# Patient Record
Sex: Male | Born: 1970 | Race: White | Hispanic: No | Marital: Married | Smoking: Former smoker
Health system: Southern US, Community
[De-identification: ages and names within clinical notes are randomized; demographics above are authoritative.]

## PROBLEM LIST (undated history)

## (undated) DIAGNOSIS — K8689 Other specified diseases of pancreas: Secondary | ICD-10-CM

## (undated) DIAGNOSIS — J690 Pneumonitis due to inhalation of food and vomit: Secondary | ICD-10-CM

## (undated) DIAGNOSIS — C241 Malignant neoplasm of ampulla of Vater: Secondary | ICD-10-CM

## (undated) DIAGNOSIS — K219 Gastro-esophageal reflux disease without esophagitis: Secondary | ICD-10-CM

## (undated) DIAGNOSIS — F419 Anxiety disorder, unspecified: Secondary | ICD-10-CM

## (undated) DIAGNOSIS — G8929 Other chronic pain: Secondary | ICD-10-CM

## (undated) DIAGNOSIS — Z87442 Personal history of urinary calculi: Secondary | ICD-10-CM

---

## 2007-01-13 ENCOUNTER — Emergency Department (HOSPITAL_COMMUNITY): Admission: EM | Admit: 2007-01-13 | Discharge: 2007-01-13 | Payer: Self-pay | Admitting: *Deleted

## 2012-07-22 ENCOUNTER — Other Ambulatory Visit (HOSPITAL_COMMUNITY): Payer: Self-pay | Admitting: Family Medicine

## 2012-07-22 ENCOUNTER — Ambulatory Visit (HOSPITAL_COMMUNITY)
Admission: RE | Admit: 2012-07-22 | Discharge: 2012-07-22 | Disposition: A | Discharge status: Home / Self Care | Payer: PRIVATE HEALTH INSURANCE | Source: Ambulatory Visit | Attending: Family Medicine | Admitting: Family Medicine

## 2012-07-22 DIAGNOSIS — M79671 Pain in right foot: Secondary | ICD-10-CM

## 2012-07-22 DIAGNOSIS — M25579 Pain in unspecified ankle and joints of unspecified foot: Secondary | ICD-10-CM | POA: Insufficient documentation

## 2012-07-22 DIAGNOSIS — S8990XA Unspecified injury of unspecified lower leg, initial encounter: Secondary | ICD-10-CM | POA: Insufficient documentation

## 2012-07-22 DIAGNOSIS — S99919A Unspecified injury of unspecified ankle, initial encounter: Secondary | ICD-10-CM | POA: Insufficient documentation

## 2012-07-22 DIAGNOSIS — S99929A Unspecified injury of unspecified foot, initial encounter: Secondary | ICD-10-CM | POA: Insufficient documentation

## 2012-07-22 DIAGNOSIS — M25571 Pain in right ankle and joints of right foot: Secondary | ICD-10-CM

## 2012-07-22 DIAGNOSIS — X500XXA Overexertion from strenuous movement or load, initial encounter: Secondary | ICD-10-CM | POA: Insufficient documentation

## 2016-03-21 ENCOUNTER — Encounter: Payer: Self-pay | Admitting: Gastroenterology

## 2016-03-21 ENCOUNTER — Ambulatory Visit (INDEPENDENT_AMBULATORY_CARE_PROVIDER_SITE_OTHER): Payer: PRIVATE HEALTH INSURANCE | Admitting: Gastroenterology

## 2016-03-21 VITALS — BP 124/67 | HR 89 | Temp 98.0°F | Ht 71.0 in | Wt 263.2 lb

## 2016-03-21 DIAGNOSIS — R7989 Other specified abnormal findings of blood chemistry: Secondary | ICD-10-CM

## 2016-03-21 DIAGNOSIS — R945 Abnormal results of liver function studies: Secondary | ICD-10-CM

## 2016-03-21 DIAGNOSIS — D649 Anemia, unspecified: Secondary | ICD-10-CM | POA: Diagnosis not present

## 2016-03-21 DIAGNOSIS — K921 Melena: Secondary | ICD-10-CM | POA: Diagnosis not present

## 2016-03-21 NOTE — Assessment & Plan Note (Signed)
46 year old gentleman with recent bump in LFTs, jaundice, pruritus of unclear etiology. Viral markers negative. Abdominal ultrasound with fatty liver. CBD diameter normal. No gallstones or gallbladder disease noted. Interestingly, patient had been on a ketogenic diet, losing 40 pounds in 2 months, using Lipozene. According to PCP note he was also using MVI, testosterone booster the patient did not disclose this information to me. Denies alcohol abuse. Outside of pruritus, clinically he has been feeling okay. Interestingly patient has noted greatest benefit after taking prednisone for the itching last week.  Recommend checking for hemachromatosis, autoimmune hepatitis, PBC. Have not ruled out drug effect. Also appears to have new anemia, reported melena 3 weeks ago. Intermittent heartburn well-controlled on Zantac. Plan to update CBC. Further recommendations to follow.

## 2016-03-21 NOTE — Patient Instructions (Signed)
1. Do not take anything for weight loss or herbal medications until we determine source for recent liver issues.  2. I will review records from [facility] and let you know what additional labs are needed.

## 2016-03-21 NOTE — Progress Notes (Signed)
Primary Care Physician:  Curlene Labrum, MD  Primary Gastroenterologist:  Garfield Cornea, MD   Chief Complaint  Patient presents with  . Abnormal LFT'S    HPI:  Luis Frey is a 46 y.o. 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   Patient states he was told at the plasma center in [place] that his ALT was 3X normal and he needed to see his PCP. He had also noted 3 weeks of itching, dark colored urine and decided to go to ER. When he presented January 23 his total bilirubin was 4.9, alkaline phosphatase 347, AST 69.2, ALT 169, albumin 3.9. CBC unremarkable. The following day his total bilirubin was 4.6 with direct bilirubin 3.7, AST 65, ALT 154, alkaline phosphatase 290. January 25 total bilirubin 3.2, direct bilirubin 2.2, alkaline phosphatase 268, AST 84.7, ALT 180. Hepatitis A IgM negative, hepatitis B surface antigen negative, hepatitis B core IgM negative, hepatitis C antibody negative.   Labs dated 03/16/2016 by PCP with hemoglobin 10.9, hematocrit 33.9, MCV 88, platelets were 10,000, white blood cell count 7000, total bilirubin 1.9, alkaline phosphatase 279, AST 35, ALT 69, albumin 3.7. TSH 1.3. Amylase and lipase were normal.  Abdominal ultrasound 03/07/2016, gallbladder unremarkable. Fatty liver. Increased echotexture of the soft tissues adjacent to the left kidney, nonspecific. 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  Patient reports pain on a ketogenic diet for about 2 months. Extremely low-carb eating. Also taking over-the-counter Lipozene which she stopped when the itching started about 3 weeks ago. Also he is using multivitamins, testosterone booster he got on line. Denies any other herbal medications. Reports losing 40 pounds in 2 months. Reports black stools for 2 days, currently about 3 weeks ago. Denies bright red blood per rectum. No constipation or diarrhea. He does have  intermittent severe heartburn which responds to Zantac within 10 minutes of taking. He denies abdominal pain. Remote aspirin powder abuse 5 years ago. Takes Aleve or Tylenol on occasion but not very often. Never takes more than the bottle recommends. Drives a truck for a living. Does not like to take medications. No illegal drugs. Does consume moonshine about once every 3 months. The last time was Super Bowl Sunday but had been several months before that since he drank.  He was treated with prednisone pack for February 1 of the sixth. Started on hydroxyzine. Patient states this helped his itching.   Patient is very interested in resuming Lipozene.     Current Outpatient Prescriptions  Medication Sig Dispense Refill  . hydrOXYzine (ATARAX/VISTARIL) 25 MG tablet Take by mouth.     No current facility-administered medications for this visit.     Allergies as of 03/21/2016  . (No Known Allergies)    No past medical history on file. NONE  Past Surgical History:  Procedure Laterality Date  . none      Family History  Problem Relation Age of Onset  . Liver disease Neg Hx     Social History   Social History  . Marital status: Single    Spouse name: N/A  . Number of children: N/A  . Years of education: N/A   Occupational History  . Not on file.   Social History Main Topics  . Smoking status: Never Smoker  . Smokeless tobacco: Never Used  . Alcohol use Yes     Comment: socially , moonshine once every 3 months  . Drug use: No  .  Sexual activity: Not on file   Other Topics Concern  . Not on file   Social History Narrative  . No narrative on file      ROS:  General: Negative for anorexia, unintentional weight loss, fever, chills, fatigue, weakness. Eyes: Negative for vision changes.  ENT: Negative for hoarseness, difficulty swallowing , nasal congestion. CV: Negative for chest pain, angina, palpitations, dyspnea on exertion, peripheral edema.  Respiratory: Negative  for dyspnea at rest, dyspnea on exertion, cough, sputum, wheezing.  GI: See history of present illness. GU:  Negative for dysuria, hematuria, urinary incontinence, urinary frequency, nocturnal urination.  MS: Negative for joint pain, low back pain.  Derm: Negative for rash. + itching but improved Neuro: Negative for weakness, abnormal sensation, seizure, frequent headaches, memory loss, confusion.  Psych: Negative for anxiety, depression, suicidal ideation, hallucinations.  Endo: Negative for unusual weight change.  Heme: Negative for bruising or bleeding. Allergy: Negative for rash or hives.    Physical Examination:  BP 124/67   Pulse 89   Temp 98 F (36.7 C) (Oral)   Ht 5\' 11"  (1.803 m)   Wt 263 lb 3.2 oz (119.4 kg)   BMI 36.71 kg/m    General: Well-nourished, well-developed in no acute distress.  Head: Normocephalic, atraumatic.   Eyes: Conjunctiva pink, no icterus. Mouth: Oropharyngeal mucosa moist and pink , no lesions erythema or exudate. Neck: Supple without thyromegaly, masses, or lymphadenopathy.  Lungs: Clear to auscultation bilaterally.  Heart: Regular rate and rhythm, no murmurs rubs or gallops.  Abdomen: Bowel sounds are normal, nontender, nondistended, no hepatosplenomegaly or masses, no abdominal bruits or    hernia , no rebound or guarding.   Rectal: not performed Extremities: No lower extremity edema. No clubbing or deformities.  Neuro: Alert and oriented x 4 , grossly normal neurologically.  Skin: Warm and dry, no rash or jaundice.   Psych: Alert and cooperative, normal mood and affect.  Labs: See above  Imaging Studies: No results found.

## 2016-03-21 NOTE — Progress Notes (Signed)
These let patient know that I received his records and have reviewed them. He still needs further labs, orders placed. Please have them done within the next 1-2 days.

## 2016-03-22 NOTE — Progress Notes (Signed)
cc'ed to pcp °

## 2016-03-22 NOTE — Progress Notes (Signed)
Tried to call pt- NA-LMOM asking him to go to the lab for additional blood work.

## 2016-03-22 NOTE — Progress Notes (Signed)
Pt is aware. He requested that I fax the orders to [facility] so he can have it done there because he lives in [place] and he can go by there when he gets off work. I have faxed the orders to [facility]. Pt said he would have it done today.

## 2016-04-13 ENCOUNTER — Telehealth: Payer: Self-pay | Admitting: Gastroenterology

## 2016-04-13 NOTE — Telephone Encounter (Signed)
I have not seen labs yet on this patient. He said he was going to have done at [facility]. Can we check on this.

## 2016-04-13 NOTE — Telephone Encounter (Signed)
Only labs received from [facility] were old. I bet he hasn't had my labs done yet.   Can we ask patient to have his labs done?

## 2016-04-13 NOTE — Telephone Encounter (Signed)
Requested any recent labs from [facility] °

## 2016-04-17 NOTE — Telephone Encounter (Signed)
Tried to call pt- LM. Also mailed letter and lab orders to the pt.

## 2016-04-17 NOTE — Telephone Encounter (Signed)
noted 

## 2016-07-04 ENCOUNTER — Telehealth: Payer: Self-pay | Admitting: Internal Medicine

## 2016-07-04 ENCOUNTER — Other Ambulatory Visit: Payer: Self-pay | Admitting: Gastroenterology

## 2016-07-04 ENCOUNTER — Other Ambulatory Visit (HOSPITAL_COMMUNITY)
Admission: RE | Admit: 2016-07-04 | Discharge: 2016-07-04 | Disposition: A | Discharge status: Home / Self Care | Payer: PRIVATE HEALTH INSURANCE | Source: Ambulatory Visit | Attending: Gastroenterology | Admitting: Gastroenterology

## 2016-07-04 ENCOUNTER — Ambulatory Visit (HOSPITAL_COMMUNITY)
Admission: RE | Admit: 2016-07-04 | Discharge: 2016-07-04 | Disposition: A | Discharge status: Home / Self Care | Payer: PRIVATE HEALTH INSURANCE | Source: Ambulatory Visit | Attending: Gastroenterology | Admitting: Gastroenterology

## 2016-07-04 ENCOUNTER — Telehealth: Payer: Self-pay

## 2016-07-04 DIAGNOSIS — R1011 Right upper quadrant pain: Secondary | ICD-10-CM

## 2016-07-04 DIAGNOSIS — R7989 Other specified abnormal findings of blood chemistry: Secondary | ICD-10-CM

## 2016-07-04 DIAGNOSIS — R945 Abnormal results of liver function studies: Secondary | ICD-10-CM

## 2016-07-04 DIAGNOSIS — K838 Other specified diseases of biliary tract: Secondary | ICD-10-CM

## 2016-07-04 LAB — CBC WITH DIFFERENTIAL/PLATELET
BASOS PCT: 0 %
Basophils Absolute: 0 10*3/uL (ref 0.0–0.1)
EOS ABS: 0.2 10*3/uL (ref 0.0–0.7)
Eosinophils Relative: 3 %
HCT: 36 % — ABNORMAL LOW (ref 39.0–52.0)
HEMOGLOBIN: 11.8 g/dL — AB (ref 13.0–17.0)
Lymphocytes Relative: 47 %
Lymphs Abs: 3.4 10*3/uL (ref 0.7–4.0)
MCH: 27.4 pg (ref 26.0–34.0)
MCHC: 32.8 g/dL (ref 30.0–36.0)
MCV: 83.5 fL (ref 78.0–100.0)
MONOS PCT: 9 %
Monocytes Absolute: 0.6 10*3/uL (ref 0.1–1.0)
Neutro Abs: 2.9 10*3/uL (ref 1.7–7.7)
Neutrophils Relative %: 41 %
Platelets: 393 10*3/uL (ref 150–400)
RBC: 4.31 MIL/uL (ref 4.22–5.81)
RDW: 18.4 % — ABNORMAL HIGH (ref 11.5–15.5)
WBC: 7.1 10*3/uL (ref 4.0–10.5)

## 2016-07-04 LAB — HEPATIC FUNCTION PANEL
ALBUMIN: 3.1 g/dL — AB (ref 3.5–5.0)
ALK PHOS: 564 U/L — AB (ref 38–126)
ALT: 105 U/L — ABNORMAL HIGH (ref 17–63)
AST: 67 U/L — ABNORMAL HIGH (ref 15–41)
Bilirubin, Direct: 3 mg/dL — ABNORMAL HIGH (ref 0.1–0.5)
Indirect Bilirubin: 1.3 mg/dL — ABNORMAL HIGH (ref 0.3–0.9)
TOTAL PROTEIN: 6.9 g/dL (ref 6.5–8.1)
Total Bilirubin: 4.3 mg/dL — ABNORMAL HIGH (ref 0.3–1.2)

## 2016-07-04 LAB — LIPASE, BLOOD: LIPASE: 40 U/L (ref 11–51)

## 2016-07-04 MED ORDER — IOPAMIDOL (ISOVUE-300) INJECTION 61%
100.0000 mL | Freq: Once | INTRAVENOUS | Status: AC | PRN
  Administered 2016-07-04: 100 mL via INTRAVENOUS

## 2016-07-04 NOTE — Telephone Encounter (Signed)
Labs have been faxed to the hospital and he is aware to go on to the hospital

## 2016-07-04 NOTE — Telephone Encounter (Signed)
NO PA is needed per Dionne Bucy 07/04/16

## 2016-07-04 NOTE — Addendum Note (Signed)
Addended by: Mahala Menghini on: 07/04/2016 03:31 PM   Modules accepted: Orders

## 2016-07-04 NOTE — Telephone Encounter (Signed)
CTcalled about his report; markedly dilated bile duct with possible mass distal CBD/ampulla.  Needs ERCP w stent bx, etc.  I called all numbers in chart.  No luck getting him.  Will re-attempt first thing in am.

## 2016-07-04 NOTE — Telephone Encounter (Signed)
Discussed with RMR. We could not get patient's MRCP done at [facility] due to claustrophobia and tightness of machine. Per Ginger, multiple places have been called about OPEN MRI or MRI large bore and NO ONE can due before 2 weeks even if ordered STAT.   Plan for CT pancreatic protocol now.

## 2016-07-04 NOTE — Telephone Encounter (Addendum)
Reviewed records from ED visit at [facility] dated 07/02/2016. He presented with acute onset right upper quadrant abdominal pain which began several hours after eating chicken wings. Vomiting. Woke him up from sleep with it. His labs showed a total bilirubin of 3.5, alkaline phosphatase 613, AST 87.8, ALT 121, lipase 71. White blood cell count normal at 9200.   Abdominal ultrasound performed showing minimal layering non-shadowing sludge in the gallbladder. No gallbladder wall thickening or pericholecystic fluid. Technologist reported sonographic Percell Miller sign present. CBD was 15 mm, had been 4.8 mm on ultrasound in January 2018. No choledocholithiasis seen although there was nonvisualization of the distal common bile duct due to overlying bowel gas. Suggestion of mild intrahepatic biliary ductal dilation. Pancreas was not visualized.  I spoke to patient. His pain is less today. He has had to take 2 percocet in two days. Denies other recent medications. He complains of jaundice and severe itching. He is aware of the plan as outlined belowl.  Discussed with RMR.  Patient needs MRCP today (diagnosis abnormal lft, ruq pain, new dilated cbd), order stat. Creatinine 0.75 at [facility] on 07/02/16 Needs stat lfts, cbc, lipase today.

## 2016-07-04 NOTE — Addendum Note (Signed)
Addended by: Mahala Menghini on: 07/04/2016 01:15 PM   Modules accepted: Orders

## 2016-07-04 NOTE — Telephone Encounter (Signed)
Pt called- LM on voicemail, pt is having itching and yellow color in eyes. I called him and he said he went to [facility] ED on Monday and they did an MRI and told him he had sludge in his gallbladder and needed to have it removed. He said they did not refer him and he called his pcp and his pcp told him to call us. He said the itching is severe, from head to toe and it burns until he scratches it, then it stops burning. He cant sleep and he is unsure what he needs to do.   Manuela Schwartz, can you try to get records from ED visit Monday (discharge, MRI, labs)

## 2016-07-04 NOTE — Telephone Encounter (Signed)
Requested records from [facility]. 

## 2016-07-04 NOTE — Telephone Encounter (Signed)
Unfortunately patient did not follow through with our recommendations after he was seen for similar symptoms in 03/2016.   Please get records.

## 2016-07-05 ENCOUNTER — Ambulatory Visit (INDEPENDENT_AMBULATORY_CARE_PROVIDER_SITE_OTHER): Payer: PRIVATE HEALTH INSURANCE | Admitting: Gastroenterology

## 2016-07-05 ENCOUNTER — Other Ambulatory Visit: Payer: Self-pay

## 2016-07-05 ENCOUNTER — Encounter (HOSPITAL_COMMUNITY)
Admission: RE | Admit: 2016-07-05 | Discharge: 2016-07-05 | Disposition: A | Discharge status: Home / Self Care | Payer: PRIVATE HEALTH INSURANCE | Source: Ambulatory Visit | Attending: Internal Medicine | Admitting: Internal Medicine

## 2016-07-05 ENCOUNTER — Encounter: Payer: Self-pay | Admitting: Gastroenterology

## 2016-07-05 ENCOUNTER — Encounter (HOSPITAL_COMMUNITY): Payer: Self-pay

## 2016-07-05 VITALS — BP 131/84 | HR 70 | Temp 97.0°F | Ht 71.0 in | Wt 260.4 lb

## 2016-07-05 DIAGNOSIS — R7989 Other specified abnormal findings of blood chemistry: Secondary | ICD-10-CM

## 2016-07-05 DIAGNOSIS — R1011 Right upper quadrant pain: Secondary | ICD-10-CM | POA: Diagnosis present

## 2016-07-05 DIAGNOSIS — K838 Other specified diseases of biliary tract: Secondary | ICD-10-CM

## 2016-07-05 DIAGNOSIS — K831 Obstruction of bile duct: Secondary | ICD-10-CM | POA: Diagnosis not present

## 2016-07-05 DIAGNOSIS — R945 Abnormal results of liver function studies: Secondary | ICD-10-CM | POA: Diagnosis not present

## 2016-07-05 DIAGNOSIS — K8050 Calculus of bile duct without cholangitis or cholecystitis without obstruction: Secondary | ICD-10-CM | POA: Diagnosis not present

## 2016-07-05 DIAGNOSIS — C241 Malignant neoplasm of ampulla of Vater: Secondary | ICD-10-CM | POA: Diagnosis not present

## 2016-07-05 DIAGNOSIS — Z87891 Personal history of nicotine dependence: Secondary | ICD-10-CM | POA: Diagnosis not present

## 2016-07-05 HISTORY — DX: Anxiety disorder, unspecified: F41.9

## 2016-07-05 HISTORY — DX: Personal history of urinary calculi: Z87.442

## 2016-07-05 HISTORY — DX: Gastro-esophageal reflux disease without esophagitis: K21.9

## 2016-07-05 LAB — COMPREHENSIVE METABOLIC PANEL
ALT: 108 U/L — ABNORMAL HIGH (ref 17–63)
AST: 70 U/L — AB (ref 15–41)
Albumin: 3.1 g/dL — ABNORMAL LOW (ref 3.5–5.0)
Alkaline Phosphatase: 574 U/L — ABNORMAL HIGH (ref 38–126)
Anion gap: 9 (ref 5–15)
BILIRUBIN TOTAL: 4.2 mg/dL — AB (ref 0.3–1.2)
BUN: 12 mg/dL (ref 6–20)
CHLORIDE: 99 mmol/L — AB (ref 101–111)
CO2: 27 mmol/L (ref 22–32)
CREATININE: 0.76 mg/dL (ref 0.61–1.24)
Calcium: 9.1 mg/dL (ref 8.9–10.3)
Glucose, Bld: 106 mg/dL — ABNORMAL HIGH (ref 65–99)
POTASSIUM: 3.9 mmol/L (ref 3.5–5.1)
Sodium: 135 mmol/L (ref 135–145)
TOTAL PROTEIN: 7.1 g/dL (ref 6.5–8.1)

## 2016-07-05 MED ORDER — ZOLPIDEM TARTRATE 10 MG PO TABS: 10.0000 mg | ORAL_TABLET | Freq: Every evening | ORAL | 0 refills | Status: DC | PRN

## 2016-07-05 NOTE — Patient Instructions (Signed)
Luis Frey  07/05/2016     @PREFPERIOPPHARMACY @   Your procedure is scheduled on  07/06/2016   Report to [facility] at  [facility] this number if you have problems the morning of surgery:  [phone]   Remember:  Do not eat food or drink liquids after midnight.  Take these medicines the morning of surgery with A SIP OF WATER  None   Do not wear jewelry, make-up or nail polish.  Do not wear lotions, powders, or perfumes, or deoderant.  Do not shave 48 hours prior to surgery.  Men may shave face and neck.  Do not bring valuables to the hospital.  [facility] is not responsible for any belongings or valuables.  Contacts, dentures or bridgework may not be worn into surgery.  Leave your suitcase in the car.  After surgery it may be brought to your room.  For patients admitted to the hospital, discharge time will be determined by your treatment team.  Patients discharged the day of surgery will not be allowed to drive home.   Name and phone number of your driver:   family Special instructions: Follow the diet instructions given to you by Dr Tilman Neat office.  Please read over the following fact sheets that you were given. Anesthesia Post-op Instructions and Care and Recovery After Surgery       Endoscopic Retrograde Cholangiopancreatogram Endoscopic retrograde cholangiopancreatogram (ERCP) is a procedure that may be used to diagnose or treat problems with the pancreas, bile ducts, liver, and gallbladder. For this procedure, a thin, lighted tube (endoscope) is passed through the mouth, the throat, and down into the areas being checked. The endoscope has a camera that allows the areas to be viewed. Dye is injected and then X-rays are taken to further study the areas. During ERCP, other procedures may also be done to help diagnose or treat problems that are found. For example, stones can be removed, or a tissue sample can be taken out for testing  (biopsy). Tell a health care provider about:  Any allergies you have.  All medicines you are taking, including vitamins, herbs, eye drops, creams, and over-the-counter medicines.  Any problems you or family members have had with anesthetic medicines.  Any blood disorders you have.  Any surgeries you have had.  Any medical conditions you have.  Whether you are pregnant or may be pregnant. What are the risks? Generally, this is a safe procedure. However, problems may occur, including:  Pancreatitis.  Infection.  Bleeding.  Allergic reactions to medicines or dyes.  Accidental punctures in the bowel wall, pancreas, or gallbladder.  Damage to other structures or organs. What happens before the procedure? Staying hydrated  Follow instructions from your health care provider about hydration, which may include:  Up to 2 hours before the procedure - you may continue to drink clear liquids, such as water, clear fruit juice, black coffee, and plain tea. Eating and drinking restrictions  Follow instructions from your health care provider about eating and drinking, which may include:  8 hours before the procedure - stop eating heavy meals or foods such as meat, fried foods, or fatty foods.  6 hours before the procedure - stop eating light meals or foods, such as toast or cereal.  6 hours before the procedure - stop drinking milk or drinks that contain milk.  2 hours before the procedure - stop drinking clear liquids. General instructions  Ask your health care provider about:  Changing or stopping your regular medicines. This is especially important if you are taking diabetes medicines or blood thinners.  Taking medicines such as aspirin and ibuprofen. These medicines can thin your blood. Do not take these medicines before your procedure if your health care provider instructs you not to.  Plan to have someone take you home from the hospital or clinic.  If you will be going  home right after the procedure, plan to have someone with you for 24 hours. What happens during the procedure?  To lower your risk of infection, your health care team will wash or sanitize their hands.  An IV tube will be inserted into one of your veins.  You will be given one or more of the following:  A medicine to help you relax (sedative).  A medicine to numb the throat area (local anesthetic) and prevent gagging. Your throat may be sprayed with this medicine, or you may gargle the medicine.  A medicine to make you fall asleep (general anesthetic).  A medicine to lower your risk of infection (antibiotic), inflammation (anti-inflammatory), or both.  You will lie on your left side.  The endoscope will be inserted through your mouth, down the back of the throat, and into the first part of the small intestine (duodenum).  Then a small, plastic tube (cannula) will be passed through the endoscope and directed into the bile duct or pancreatic duct.  Dye will be injected through the cannula to make structures easier to see on an X-ray.  X-rays will be taken to study the biliary and pancreatic passageways. You may be positioned on your abdomen or your back during the X-rays.  A small sample of tissue (biopsy) may be removed for examination, or other procedures may be done to fix problems that are found. The procedure may vary among health care providers and hospitals. What happens after the procedure?  Your blood pressure, heart rate, breathing rate, and blood oxygen level will be monitored until the medicines you were given have worn off.  Your throat may feel slightly sore.  You will not be allowed to eat or drink until numbness subsides.  Do not drive for 24 hours if you were given a sedative. Summary  Endoscopic retrograde cholangiopancreatogram is a procedure that may be used to diagnose or treat problems with the pancreas, bile ducts, liver, and gallbladder.  During ERCP,  other procedures may also be done to help diagnose or treat problems that are found. For example, stones can be removed, or a tissue sample can be taken out for testing (biopsy).  Generally, this is a safe procedure. However, problems may occur, including infection, bleeding, pancreatitis, accidental damage to other structures or organs, and allergic reactions to medicines or dyes.  The procedure may vary among health care providers and hospitals. This information is not intended to replace advice given to you by your health care provider. Make sure you discuss any questions you have with your health care provider. Document Released: 10/24/2000 Document Revised: 12/26/2015 Document Reviewed: 12/26/2015 Elsevier Interactive Patient Education  2017 [employer]  Endoscopic Retrograde Cholangiopancreatogram, Care After This sheet gives you information about how to care for yourself after your procedure. Your health care provider may also give you more specific instructions. If you have problems or questions, contact your health care provider. What can I expect after the procedure? After the procedure, it is common to have:  Soreness in your throat.  Nausea.  Bloating.  Dizziness.  Tiredness (fatigue). Follow these instructions at home:  Take over-the-counter and prescription medicines only as told by your health care provider.  Do not drive for 24 hours if you were given a medicine to help you relax (sedative) during your procedure. Have someone stay with you for 24 hours after the procedure.  Return to your normal activities as told by your health care provider. Ask your health care provider what activities are safe for you.  Return to eating what you normally do as soon as you feel well enough or as told by your health care provider.  Keep all follow-up visits as told by your health care provider. This is important. Contact a health care provider if:  You have pain in your  abdomen that does not get better with medicine.  You develop signs of infection, such as:  Chills.  Feeling unwell. Get help right away if:  You have difficulty swallowing.  You have worsening pain in your throat, chest, or abdomen.  You vomit bright red blood or a substance that looks like coffee grounds.  You have bloody or very black stools.  You have a fever.  You have a sudden increase in swelling (bloating) in your abdomen. Summary  After the procedure, it is common to feel tired and to have some discomfort in your throat.  Contact your health care provider if you have signs of infection-such as chills or feeling unwell-or if you have pain that does not improve with medicine.  Get help right away if you have trouble swallowing, worsening pain, bloody or black vomit, bloody or black stools, a fever, or increased swelling in your abdomen.  Keep all follow-up visits as told by your health care provider. This is important. This information is not intended to replace advice given to you by your health care provider. Make sure you discuss any questions you have with your health care provider. Document Released: 11/19/2012 Document Revised: 12/19/2015 Document Reviewed: 12/19/2015 Elsevier Interactive Patient Education  2017 [place] Anesthesia, Adult General anesthesia is the use of medicines to make a person "go to sleep" (be unconscious) for a medical procedure. General anesthesia is often recommended when a procedure:  Is long.  Requires you to be still or in an unusual position.  Is major and can cause you to lose blood.  Is impossible to do without general anesthesia. The medicines used for general anesthesia are called general anesthetics. In addition to making you sleep, the medicines:  Prevent pain.  Control your blood pressure.  Relax your muscles. Tell a health care provider about:  Any allergies you have.  All medicines you are taking,  including vitamins, herbs, eye drops, creams, and over-the-counter medicines.  Any problems you or family members have had with anesthetic medicines.  Types of anesthetics you have had in the past.  Any bleeding disorders you have.  Any surgeries you have had.  Any medical conditions you have.  Any history of heart or lung conditions, such as heart failure, sleep apnea, or chronic obstructive pulmonary disease (COPD).  Whether you are pregnant or may be pregnant.  Whether you use tobacco, alcohol, marijuana, or street drugs.  Any history of Armed forces logistics/support/administrative officer.  Any history of depression or anxiety. What are the risks? Generally, this is a safe procedure. However, problems may occur, including:  Allergic reaction to anesthetics.  Lung and heart problems.  Inhaling food or liquids from your stomach into your lungs (aspiration).  Injury  to nerves.  Waking up during your procedure and being unable to move (rare).  Extreme agitation or a state of mental confusion (delirium) when you wake up from the anesthetic.  Air in the bloodstream, which can lead to stroke. These problems are more likely to develop if you are having a major surgery or if you have an advanced medical condition. You can prevent some of these complications by answering all of your health care provider's questions thoroughly and by following all pre-procedure instructions. General anesthesia can cause side effects, including:  Nausea or vomiting  A sore throat from the breathing tube.  Feeling cold or shivery.  Feeling tired, washed out, or achy.  Sleepiness or drowsiness.  Confusion or agitation. What happens before the procedure? Staying hydrated  Follow instructions from your health care provider about hydration, which may include:  Up to 2 hours before the procedure - you may continue to drink clear liquids, such as water, clear fruit juice, black coffee, and plain tea. Eating and drinking  restrictions  Follow instructions from your health care provider about eating and drinking, which may include:  8 hours before the procedure - stop eating heavy meals or foods such as meat, fried foods, or fatty foods.  6 hours before the procedure - stop eating light meals or foods, such as toast or cereal.  6 hours before the procedure - stop drinking milk or drinks that contain milk.  2 hours before the procedure - stop drinking clear liquids. Medicines   Ask your health care provider about:  Changing or stopping your regular medicines. This is especially important if you are taking diabetes medicines or blood thinners.  Taking medicines such as aspirin and ibuprofen. These medicines can thin your blood. Do not take these medicines before your procedure if your health care provider instructs you not to.  Taking new dietary supplements or medicines. Do not take these during the week before your procedure unless your health care provider approves them.  If you are told to take a medicine or to continue taking a medicine on the day of the procedure, take the medicine with sips of water. General instructions    Ask if you will be going home the same day, the following day, or after a longer hospital stay.  Plan to have someone take you home.  Plan to have someone stay with you for the first 24 hours after you leave the hospital or clinic.  For 3-6 weeks before the procedure, try not to use any tobacco products, such as cigarettes, chewing tobacco, and e-cigarettes.  You may brush your teeth on the morning of the procedure, but make sure to spit out the toothpaste. What happens during the procedure?  You will be given anesthetics through a mask and through an IV tube in one of your veins.  You may receive medicine to help you relax (sedative).  As soon as you are asleep, a breathing tube may be used to help you breathe.  An anesthesia specialist will stay with you throughout  the procedure. He or she will help keep you comfortable and safe by continuing to give you medicines and adjusting the amount of medicine that you get. He or she will also watch your blood pressure, pulse, and oxygen levels to make sure that the anesthetics do not cause any problems.  If a breathing tube was used to help you breathe, it will be removed before you wake up. The procedure may vary among health care  providers and hospitals. What happens after the procedure?  You will wake up, often slowly, after the procedure is complete, usually in a recovery area.  Your blood pressure, heart rate, breathing rate, and blood oxygen level will be monitored until the medicines you were given have worn off.  You may be given medicine to help you calm down if you feel anxious or agitated.  If you will be going home the same day, your health care provider may check to make sure you can stand, drink, and urinate.  Your health care providers will treat your pain and side effects before you go home.  Do not drive for 24 hours if you received a sedative.  You may:  Feel nauseous and vomit.  Have a sore throat.  Have mental slowness.  Feel cold or shivery.  Feel sleepy.  Feel tired.  Feel sore or achy, even in parts of your body where you did not have surgery. This information is not intended to replace advice given to you by your health care provider. Make sure you discuss any questions you have with your health care provider. Document Released: 05/08/2007 Document Revised: 07/12/2015 Document Reviewed: 01/13/2015 Elsevier Interactive Patient Education  2017 [place] Anesthesia, Adult, Care After These instructions provide you with information about caring for yourself after your procedure. Your health care provider may also give you more specific instructions. Your treatment has been planned according to current medical practices, but problems sometimes occur. Call your health  care provider if you have any problems or questions after your procedure. What can I expect after the procedure? After the procedure, it is common to have:  Vomiting.  A sore throat.  Mental slowness. It is common to feel:  Nauseous.  Cold or shivery.  Sleepy.  Tired.  Sore or achy, even in parts of your body where you did not have surgery. Follow these instructions at home: For at least 24 hours after the procedure:   Do not:  Participate in activities where you could fall or become injured.  Drive.  Use heavy machinery.  Drink alcohol.  Take sleeping pills or medicines that cause drowsiness.  Make important decisions or sign legal documents.  Take care of children on your own.  Rest. Eating and drinking   If you vomit, drink water, juice, or soup when you can drink without vomiting.  Drink enough fluid to keep your urine clear or pale yellow.  Make sure you have little or no nausea before eating solid foods.  Follow the diet recommended by your health care provider. General instructions   Have a responsible adult stay with you until you are awake and alert.  Return to your normal activities as told by your health care provider. Ask your health care provider what activities are safe for you.  Take over-the-counter and prescription medicines only as told by your health care provider.  If you smoke, do not smoke without supervision.  Keep all follow-up visits as told by your health care provider. This is important. Contact a health care provider if:  You continue to have nausea or vomiting at home, and medicines are not helpful.  You cannot drink fluids or start eating again.  You cannot urinate after 8-12 hours.  You develop a skin rash.  You have fever.  You have increasing redness at the site of your procedure. Get help right away if:  You have difficulty breathing.  You have chest pain.  You have unexpected bleeding.  You feel that  you are having a life-threatening or urgent problem. This information is not intended to replace advice given to you by your health care provider. Make sure you discuss any questions you have with your health care provider. Document Released: 05/07/2000 Document Revised: 07/04/2015 Document Reviewed: 01/13/2015 Elsevier Interactive Patient Education  2017 [employer].

## 2016-07-05 NOTE — Progress Notes (Signed)
Primary Care Physician:  Boyd, Amy H, PA-C  Primary Gastroenterologist:  Jayziah Rourk, MD   Chief Complaint  Patient presents with  . Abdominal Pain    ruq, ERCP tomorrow  . Emesis    HPI:  Luis Frey is a 46 y.o. male here to schedule ERCP. He was seen initially in 03/2016 for abnormal LFTs. Initially discovered at a plasma center in [place]. At that time he had itching, jaundice and was seen in ED 02/2016, total blirubin 4.9, alk phos 347, AST 69, ALT 169. Viral markers negative. Few weeks later in 03/2016 his Total bili was 1.9, AP 279, AST 35, ALT 69. abd u/s 02/2016 with fatty liver. Patient had been on ketogenic diet for 2 months, taking Lipozene prior to these symptoms. Had lost 40 pounds in 2 months, intentionally. He also had new anemia and reported melena. Patient advised to avoid all weight loss or herbal medications, avoid extreme low carb diet. Multiple labs ordered but unfortunately patient did not have them done even with multiple requests. He states he felt better and had too many other things to deal with.   Monday patient developed acute onset ruq pain with vomiting, itching and  Jaundice as well. Seen in ED at [facility]. abd u/s performed with NEW dilation of CBD from 4.8 to 15mm since 02/2016. Distal CBD could not be seen. Layering nonshadowing sludge in the gallbladder. Tbili 3.5, AP 613, AST 87, ALT 121. We sent him for stat MRCP to evaluate bile duct but patient could not tolerate. STAT CT ABD done yesterday showing CBD 17mm, intrahepatic ductal dilation as well, enhancing nodular lesion in distal CBD at ampulla 12 X 11mm, gb distended to 5.2cm, no gallstones by CT. Pancreas normal.   Patient worked today. He vomited after breakfast. Some ruq pain and fullness constant, no sharp pain like Monday. More nagging type. . BM regular. No melena, brbpr.   Current Outpatient Prescriptions  Medication Sig Dispense Refill  . oxyCODONE-acetaminophen (PERCOCET) 5-325 MG tablet Take 1-2  tablets by mouth every 4 (four) hours as needed for severe pain.    . zolpidem (AMBIEN) 10 MG tablet Take 1 tablet (10 mg total) by mouth at bedtime as needed for sleep. Take at least 7 hours before planned awakening. 14 tablet 0   No current facility-administered medications for this visit.     Allergies as of 07/05/2016 - Review Complete 07/05/2016  Allergen Reaction Noted  . Poison oak extract  07/05/2016    Past Medical History:  Diagnosis Date  . Anxiety   . GERD (gastroesophageal reflux disease)   . History of kidney stones     Past Surgical History:  Procedure Laterality Date  . none      Family History  Problem Relation Age of Onset  . Liver disease Neg Hx     Social History   Social History  . Marital status: Single    Spouse name: N/A  . Number of children: N/A  . Years of education: N/A   Occupational History  . Not on file.   Social History Main Topics  . Smoking status: Former Smoker    Packs/day: 1.00    Years: 24.00    Types: Cigarettes    Quit date: 07/06/2011  . Smokeless tobacco: Former User    Types: Snuff    Quit date: 07/06/1982  . Alcohol use Yes     Comment: socially , moonshine once every 3 months  . Drug use: No  .   Sexual activity: Not on file   Other Topics Concern  . Not on file   Social History Narrative  . No narrative on file      ROS:  General: Negative for anorexia, weight loss, fever, chills, fatigue, weakness. Eyes: Negative for vision changes.  ENT: Negative for hoarseness, difficulty swallowing , nasal congestion. CV: Negative for chest pain, angina, palpitations, dyspnea on exertion, peripheral edema.  Respiratory: Negative for dyspnea at rest, dyspnea on exertion, cough, sputum, wheezing.  GI: See history of present illness. GU:  Negative for dysuria, hematuria, urinary incontinence, urinary frequency, nocturnal urination.  MS: Negative for joint pain, low back pain.  Derm: Negative for rash. + itching.   Neuro: Negative for weakness, abnormal sensation, seizure, frequent headaches, memory loss, confusion.  Psych: Negative for anxiety, depression, suicidal ideation, hallucinations.  Endo: Negative for unusual weight change.  Heme: Negative for bruising or bleeding. Allergy: Negative for rash or hives.    Physical Examination:  BP 131/84   Pulse 70   Temp 97 F (36.1 C) (Oral)   Ht 5' 11" (1.803 m)   Wt 260 lb 6.4 oz (118.1 kg)   BMI 36.32 kg/m    General: Well-nourished, well-developed in no acute distress.  Head: Normocephalic, atraumatic.   Eyes: Conjunctiva pink, no icterus. Mouth: Oropharyngeal mucosa moist and pink , no lesions erythema or exudate. Neck: Supple without thyromegaly, masses, or lymphadenopathy.  Lungs: Clear to auscultation bilaterally.  Heart: Regular rate and rhythm, no murmurs rubs or gallops.  Abdomen: Bowel sounds are normal, fullness in ruq with moderate tenderness, nondistended, no hepatosplenomegaly or masses, no abdominal bruits or    hernia , no rebound or guarding.   Rectal: not performed Extremities: No lower extremity edema. No clubbing or deformities.  Neuro: Alert and oriented x 4 , grossly normal neurologically.  Skin: Warm and dry, no rash or jaundice.   Psych: Alert and cooperative, normal mood and affect.  Labs: Lab Results  Component Value Date   WBC 7.1 07/04/2016   HGB 11.8 (L) 07/04/2016   HCT 36.0 (L) 07/04/2016   MCV 83.5 07/04/2016   PLT 393 07/04/2016   Lab Results  Component Value Date   CREATININE 0.76 07/05/2016   BUN 12 07/05/2016   NA 135 07/05/2016   K 3.9 07/05/2016   CL 99 (L) 07/05/2016   CO2 27 07/05/2016   Lab Results  Component Value Date   ALT 108 (H) 07/05/2016   AST 70 (H) 07/05/2016   ALKPHOS 574 (H) 07/05/2016   BILITOT 4.2 (H) 07/05/2016   No results found for: INR, PROTIME Lab Results  Component Value Date   LIPASE 40 07/04/2016     Imaging Studies: Ct Pancreas Abd W/wo  Result Date:  07/04/2016 CLINICAL DATA:  Dilated biliary tree ultrasound. RIGHT quadrant pain. Elevated bilirubin. EXAM: CT ABDOMEN WITHOUT AND WITH CONTRAST TECHNIQUE: Multidetector CT imaging of the abdomen was performed following the standard protocol before and following the bolus administration of intravenous contrast. CONTRAST:  160m ISOVUE-300 IOPAMIDOL (ISOVUE-300) INJECTION 61% COMPARISON:  Ultrasound 07/02/2016, CT 01/02/2014 FINDINGS: Lower chest: Lung bases are clear. Hepatobiliary: Moderate intrahepatic duct dilatation. Marked extrahepatic duct dilatation. The common hepatic duct measures 17 mm. The common bile duct measures 16 mm. There is an enhancing nodular lesion within the distal common bile duct at the level of the ampulla measuring 12 mm x 11 mm. (Image 69, series 9). There is no focal lesion within the liver. The gallbladder is distended to 5.2  cm. The gallbladder contains no gallstones by CT. Pancreas: There is no pancreatic duct dilatation. No pancreatic inflammation. Spleen: Normal spleen Adrenals/urinary tract: Adrenal glands and kidneys are normal. The ureters and bladder normal. Stomach/Bowel: Stomach, duodenum limited view of the colon is unremarkable. Vascular/Lymphatic: Abdominal or is normal. No retroperitoneal adenopathy. Other: No free fluid. Musculoskeletal: No aggressive osseous lesion. IMPRESSION: 1. Severe extrahepatic duct dilatation and moderate intrahepatic duct dilatation secondary to obstructing lesion in the distal common bile duct at the level of the ampulla. 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 Recommend ERCP for tissue sampling and relief of the biliary obstructive pattern. 2. Distention of the gallbladder likely relates to obstruction of the common bile duct. 3. No evidence of pancreatic lesion or acute pancreatitis. 4. No lymphadenopathy. These results will be called to the ordering  clinician or representative by the Radiologist Assistant, and communication documented in the PACS or zVision Dashboard. Electronically Signed   By: Stewart  Edmunds M.D.   On: 07/04/2016 18:14     

## 2016-07-05 NOTE — Progress Notes (Signed)
I have gone over results with patient. I have explained ERCP with stenting/bx/brushings. He is aware we need to update H+P to expedite process tomorrow. Luis Frey is working on how to get this done today.

## 2016-07-05 NOTE — Patient Instructions (Addendum)
1. Complete your pre-op appointment today as planned.  2. Procedure tomorrow to evaluate your bile duct to see why it is blocked. You may have a stent placed, we will let you know after the procedure. If you do have a stent placed, it will need to be removed in 6-8 weeks.  3. Prescription for Ambien to help you sleep. DO NOT take more than one per day.   Endoscopic Retrograde Cholangiopancreatogram Endoscopic retrograde cholangiopancreatogram (ERCP) is a procedure that may be used to diagnose or treat problems with the pancreas, bile ducts, liver, and gallbladder. For this procedure, a thin, lighted tube (endoscope) is passed through the mouth, the throat, and down into the areas being checked. The endoscope has a camera that allows the areas to be viewed. Dye is injected and then X-rays are taken to further study the areas. During ERCP, other procedures may also be done to help diagnose or treat problems that are found. For example, stones can be removed, or a tissue sample can be taken out for testing (biopsy). Tell a health care provider about:  Any allergies you have.  All medicines you are taking, including vitamins, herbs, eye drops, creams, and over-the-counter medicines.  Any problems you or family members have had with anesthetic medicines.  Any blood disorders you have.  Any surgeries you have had.  Any medical conditions you have.  Whether you are pregnant or may be pregnant. What are the risks? Generally, this is a safe procedure. However, problems may occur, including:  Pancreatitis.  Infection.  Bleeding.  Allergic reactions to medicines or dyes.  Accidental punctures in the bowel wall, pancreas, or gallbladder.  Damage to other structures or organs.  General instructions   Ask your health care provider about:  Changing or stopping your regular medicines. This is especially important if you are taking diabetes medicines or blood thinners.  Taking medicines  such as aspirin and ibuprofen. These medicines can thin your blood. Do not take these medicines before your procedure if your health care provider instructs you not to.  Plan to have someone take you home from the hospital or clinic.  If you will be going home right after the procedure, plan to have someone with you for 24 hours. What happens during the procedure?  To lower your risk of infection, your health care team will wash or sanitize their hands.  An IV tube will be inserted into one of your veins.  You will be given one or more of the following:  A medicine to help you relax (sedative).  A medicine to numb the throat area (local anesthetic) and prevent gagging. Your throat may be sprayed with this medicine, or you may gargle the medicine.  A medicine to make you fall asleep (general anesthetic).  A medicine to lower your risk of infection (antibiotic), inflammation (anti-inflammatory), or both.  You will lie on your left side.  The endoscope will be inserted through your mouth, down the back of the throat, and into the first part of the small intestine (duodenum).  Then a small, plastic tube (cannula) will be passed through the endoscope and directed into the bile duct or pancreatic duct.  Dye will be injected through the cannula to make structures easier to see on an X-ray.  X-rays will be taken to study the biliary and pancreatic passageways. You may be positioned on your abdomen or your back during the X-rays.  A small sample of tissue (biopsy) may be removed for examination, or  other procedures may be done to fix problems that are found. The procedure may vary among health care providers and hospitals. What happens after the procedure?  Your blood pressure, heart rate, breathing rate, and blood oxygen level will be monitored until the medicines you were given have worn off.  Your throat may feel slightly sore.  You will not be allowed to eat or drink until numbness  subsides.  Do not drive for 24 hours if you were given a sedative. Summary  Endoscopic retrograde cholangiopancreatogram is a procedure that may be used to diagnose or treat problems with the pancreas, bile ducts, liver, and gallbladder.  During ERCP, other procedures may also be done to help diagnose or treat problems that are found. For example, stones can be removed, or a tissue sample can be taken out for testing (biopsy).  Generally, this is a safe procedure. However, problems may occur, including infection, bleeding, pancreatitis, accidental damage to other structures or organs, and allergic reactions to medicines or dyes.  The procedure may vary among health care providers and hospitals. This information is not intended to replace advice given to you by your health care provider. Make sure you discuss any questions you have with your health care provider. Document Released: 10/24/2000 Document Revised: 12/26/2015 Document Reviewed: 12/26/2015 Elsevier Interactive Patient Education  2017 [employer].

## 2016-07-05 NOTE — Progress Notes (Signed)
Per Ginger, patient ate already today.  Spoke with RMR. ERCP with stent and biopsy/bile duct brushing Friday 07/06/16. Patient will need H+P done by extender. If his time is too early for Randall Hiss to do that tomorrow morning at the hospital then let me know.  Please let patient know that I want to talk with him about CT and ERCP later today once I get to the office.

## 2016-07-05 NOTE — Progress Notes (Signed)
Patient aware. ERCP scheduled.

## 2016-07-05 NOTE — Progress Notes (Signed)
Tried to call home number and "party not accepting phone calls". Tried to call cell phone, LM for return call to my cell phone. Called wife and asked her to have patient call me.   Patient needs ERCP with stent and biopsy/bile duct brushing ASAP! Spoke with Ginger about trying for today preferably per Dr. Gala Romney. Need to make a decision by 10am.   Please continue to try and contact patient.

## 2016-07-05 NOTE — Assessment & Plan Note (Signed)
46 y/o male with obstructive jaundice, pruritis with recent acute onset ruq pain/vomiting. New CBD/intraheptic biliary dilation since 02/2016. CT concerning for growth in distal CBD at level of ampulla causing biliary obstruction and distended gallbladder. Similar presentation in 02/2016, with jaundice and pruritis but no appreciable abdominal pain. Unfortunately patient did not follow through with recommendations.   At this time he needs ERCP with biliary stenting with biopsy/bile duct brushings. Plan for tomorrow. The risks, benefits, limitations, alternatives, and imponderables have been reviewed with the patient. I specifically discussed a1 in 10 chance of pancreatitis, reaction to medications, bleeding, perforation and the possibility of a failed ERCP. Potential for sphincterotomy and stent placement also reviewed. Questions have been answered. All parties agreeable.

## 2016-07-06 ENCOUNTER — Ambulatory Visit (HOSPITAL_COMMUNITY): Payer: PRIVATE HEALTH INSURANCE | Admitting: Anesthesiology

## 2016-07-06 ENCOUNTER — Ambulatory Visit (HOSPITAL_COMMUNITY): Payer: PRIVATE HEALTH INSURANCE

## 2016-07-06 ENCOUNTER — Ambulatory Visit (HOSPITAL_COMMUNITY)
Admission: RE | Admit: 2016-07-06 | Discharge: 2016-07-06 | Disposition: A | Discharge status: Home / Self Care | Payer: PRIVATE HEALTH INSURANCE | Source: Ambulatory Visit | Attending: Internal Medicine | Admitting: Internal Medicine

## 2016-07-06 ENCOUNTER — Encounter (HOSPITAL_COMMUNITY): Payer: Self-pay | Admitting: *Deleted

## 2016-07-06 ENCOUNTER — Encounter (HOSPITAL_COMMUNITY)
Admission: RE | Disposition: A | Discharge status: Home / Self Care | Payer: Self-pay | Source: Ambulatory Visit | Attending: Internal Medicine

## 2016-07-06 DIAGNOSIS — C241 Malignant neoplasm of ampulla of Vater: Secondary | ICD-10-CM | POA: Insufficient documentation

## 2016-07-06 DIAGNOSIS — K831 Obstruction of bile duct: Secondary | ICD-10-CM | POA: Diagnosis not present

## 2016-07-06 DIAGNOSIS — R945 Abnormal results of liver function studies: Secondary | ICD-10-CM | POA: Diagnosis not present

## 2016-07-06 DIAGNOSIS — K838 Other specified diseases of biliary tract: Secondary | ICD-10-CM | POA: Diagnosis not present

## 2016-07-06 DIAGNOSIS — Z419 Encounter for procedure for purposes other than remedying health state, unspecified: Secondary | ICD-10-CM

## 2016-07-06 DIAGNOSIS — K8050 Calculus of bile duct without cholangitis or cholecystitis without obstruction: Secondary | ICD-10-CM | POA: Insufficient documentation

## 2016-07-06 DIAGNOSIS — Z87891 Personal history of nicotine dependence: Secondary | ICD-10-CM | POA: Insufficient documentation

## 2016-07-06 DIAGNOSIS — M79661 Pain in right lower leg: Secondary | ICD-10-CM

## 2016-07-06 HISTORY — PX: SPHINCTEROTOMY: SHX5279

## 2016-07-06 HISTORY — PX: BILIARY STENT PLACEMENT: SHX5538

## 2016-07-06 HISTORY — PX: ERCP: SHX5425

## 2016-07-06 HISTORY — PX: BIOPSY: SHX5522

## 2016-07-06 SURGERY — ERCP, WITH INTERVENTION IF INDICATED
Anesthesia: General

## 2016-07-06 MED ORDER — ONDANSETRON HCL 4 MG/2ML IJ SOLN
4.0000 mg | Freq: Once | INTRAMUSCULAR | Status: AC
  Administered 2016-07-06: 4 mg via INTRAVENOUS

## 2016-07-06 MED ORDER — MIDAZOLAM HCL 2 MG/2ML IJ SOLN
INTRAMUSCULAR | Status: AC
  Filled 2016-07-06: qty 2

## 2016-07-06 MED ORDER — LEVOFLOXACIN IN D5W 500 MG/100ML IV SOLN
500.0000 mg | INTRAVENOUS | Status: AC
  Administered 2016-07-06: 500 mg via INTRAVENOUS

## 2016-07-06 MED ORDER — GLYCOPYRROLATE 0.2 MG/ML IJ SOLN
INTRAMUSCULAR | Status: AC
  Filled 2016-07-06: qty 1

## 2016-07-06 MED ORDER — PROPOFOL 10 MG/ML IV BOLUS
INTRAVENOUS | Status: DC | PRN
  Administered 2016-07-06: 200 mg via INTRAVENOUS

## 2016-07-06 MED ORDER — FENTANYL CITRATE (PF) 100 MCG/2ML IJ SOLN
INTRAMUSCULAR | Status: DC | PRN
  Administered 2016-07-06: 50 ug via INTRAVENOUS
  Administered 2016-07-06: 100 ug via INTRAVENOUS
  Administered 2016-07-06: 50 ug via INTRAVENOUS

## 2016-07-06 MED ORDER — GLYCOPYRROLATE 0.2 MG/ML IJ SOLN
INTRAMUSCULAR | Status: DC | PRN
  Administered 2016-07-06: 0.6 mg via INTRAVENOUS

## 2016-07-06 MED ORDER — LACTATED RINGERS IV SOLN
INTRAVENOUS | Status: DC
  Administered 2016-07-06 (×2): via INTRAVENOUS

## 2016-07-06 MED ORDER — NEOSTIGMINE METHYLSULFATE 10 MG/10ML IV SOLN
INTRAVENOUS | Status: DC | PRN
  Administered 2016-07-06: 4 mg via INTRAVENOUS

## 2016-07-06 MED ORDER — SUCCINYLCHOLINE CHLORIDE 20 MG/ML IJ SOLN
INTRAMUSCULAR | Status: DC | PRN
  Administered 2016-07-06: 140 mg via INTRAVENOUS

## 2016-07-06 MED ORDER — MIDAZOLAM HCL 2 MG/2ML IJ SOLN
1.0000 mg | INTRAMUSCULAR | Status: AC
  Administered 2016-07-06 (×2): 2 mg via INTRAVENOUS
  Filled 2016-07-06: qty 2

## 2016-07-06 MED ORDER — GLYCOPYRROLATE 0.2 MG/ML IJ SOLN
0.2000 mg | Freq: Once | INTRAMUSCULAR | Status: AC
  Administered 2016-07-06: 0.2 mg via INTRAVENOUS

## 2016-07-06 MED ORDER — CHLORHEXIDINE GLUCONATE CLOTH 2 % EX PADS: 6.0000 | MEDICATED_PAD | Freq: Once | CUTANEOUS | Status: DC

## 2016-07-06 MED ORDER — ROCURONIUM BROMIDE 100 MG/10ML IV SOLN
INTRAVENOUS | Status: DC | PRN
  Administered 2016-07-06: 10 mg via INTRAVENOUS
  Administered 2016-07-06: 20 mg via INTRAVENOUS

## 2016-07-06 MED ORDER — ONDANSETRON HCL 4 MG/2ML IJ SOLN
INTRAMUSCULAR | Status: AC
  Filled 2016-07-06: qty 2

## 2016-07-06 MED ORDER — LIDOCAINE HCL (CARDIAC) 20 MG/ML IV SOLN
INTRAVENOUS | Status: DC | PRN
  Administered 2016-07-06: 50 mg via INTRAVENOUS

## 2016-07-06 MED ORDER — CEFAZOLIN SODIUM-DEXTROSE 2-4 GM/100ML-% IV SOLN
INTRAVENOUS | Status: AC
  Filled 2016-07-06: qty 100

## 2016-07-06 MED ORDER — LEVOFLOXACIN IN D5W 500 MG/100ML IV SOLN
INTRAVENOUS | Status: AC
  Filled 2016-07-06: qty 100

## 2016-07-06 MED ORDER — EPINEPHRINE PF 1 MG/10ML IJ SOSY
PREFILLED_SYRINGE | INTRAMUSCULAR | Status: AC
  Filled 2016-07-06: qty 10

## 2016-07-06 NOTE — Anesthesia Procedure Notes (Signed)
Procedure Name: Intubation Date/Time: 07/06/2016 1:50 PM Performed by: Andree Elk, AMY A Pre-anesthesia Checklist: Patient identified, Patient being monitored, Timeout performed, Emergency Drugs available and Suction available Patient Re-evaluated:Patient Re-evaluated prior to inductionOxygen Delivery Method: Circle System Utilized Preoxygenation: Pre-oxygenation with 100% oxygen Intubation Type: IV induction Laryngoscope Size: Miller and 3 Grade View: Grade I Tube type: Oral Tube size: 7.0 mm Number of attempts: 1 Airway Equipment and Method: Stylet Placement Confirmation: ETT inserted through vocal cords under direct vision,  positive ETCO2 and breath sounds checked- equal and bilateral Secured at: 21 cm Tube secured with: Tape Dental Injury: Teeth and Oropharynx as per pre-operative assessment

## 2016-07-06 NOTE — Progress Notes (Signed)
Radiology report came with no findings of a DVT. Dr. Gala Romney notified of report and instructed for patient to be discharged home. Patient was informed that if he had any more problems with his right leg to go to his family doctor. Patient discharged to home in stable condition.

## 2016-07-06 NOTE — Progress Notes (Signed)
CC'D TO PCP °

## 2016-07-06 NOTE — Anesthesia Postprocedure Evaluation (Signed)
Anesthesia Post Note Late Entry for 1533  Patient: JOASH TONY  Procedure(s) Performed: Procedure(s) (LRB): ENDOSCOPIC RETROGRADE CHOLANGIOPANCREATOGRAPHY (ERCP) (N/A) BILIARY STENT PLACEMENT (N/A) BIOPSY ampullary SPHINCTEROTOMY  Patient location during evaluation: PACU Anesthesia Type: General Level of consciousness: awake and alert, oriented and patient cooperative Pain management: pain level controlled Vital Signs Assessment: post-procedure vital signs reviewed and stable Respiratory status: spontaneous breathing Cardiovascular status: stable Postop Assessment: no signs of nausea or vomiting Anesthetic complications: no     Last Vitals:  Vitals:   07/06/16 1530 07/06/16 1537  BP: 111/76 111/87  Pulse: 82 77  Resp: 14 14  Temp:  36.4 C    Last Pain:  Vitals:   07/06/16 1537  TempSrc: Oral  PainSc: 5                  ADAMS, AMY A

## 2016-07-06 NOTE — Progress Notes (Signed)
Radiology in [place] Room One to perform U/S Doppler of right leg as ordered.

## 2016-07-06 NOTE — Interval H&P Note (Signed)
History and Physical Interval Note:  07/06/2016 12:54 PM  RONDAL VANDEVELDE  has presented today for surgery, with the diagnosis of abnormal CBD  The various methods of treatment have been discussed with the patient and family. After consideration of risks, benefits and other options for treatment, the patient has consented to  Procedure(s) with comments: ENDOSCOPIC RETROGRADE CHOLANGIOPANCREATOGRAPHY (ERCP) (N/A) - [place] (N/A) as a surgical intervention .  The patient's history has been reviewed, patient examined, no change in status, stable for surgery.  I have reviewed the patient's chart and labs.  Questions were answered to the patient's satisfaction.     Manus Rudd  CT reviewed. Patient seen and examined in short stay. Patient has a high-grade extra hepatic biliary obstruction. CT findings suspicious for tumor. I suppose it is possible patient could have a "sludge ball"" in his distal bile duct in association with his recent, strict ketogenic diet with which he lost a relatively large amount of weight recently.  At any rate,, he needs to have his biliary tree decompressed. Had a lengthy discussion with he and his wife about potential for a sphincterotomy, biliary tract dilation and stent placement. Potential for further procedures and/or the need for subsequent procedures at a tertiary referral center also discussed. I specifically reviewed risk of the procedure including 1 in 10 chance of pancreatitis, bleeding, perforation and reaction to medications.   Questions answered. All parties agreeable.

## 2016-07-06 NOTE — H&P (View-Only) (Signed)
Primary Care Physician:  Riley Lam  Primary Gastroenterologist:  Garfield Cornea, MD   Chief Complaint  Patient presents with  . Abdominal Pain    ruq, ERCP tomorrow  . Emesis    HPI:  Luis Frey is a 46 y.o. male here to schedule ERCP. He was seen initially in 03/2016 for abnormal LFTs. Initially discovered at a plasma center in [place]. At that time he had itching, jaundice and was seen in ED 02/2016, total blirubin 4.9, alk phos 347, AST 69, ALT 169. Viral markers negative. Few weeks later in 03/2016 his Total bili was 1.9, AP 279, AST 35, ALT 69. abd u/s 02/2016 with fatty liver. Patient had been on ketogenic diet for 2 months, taking Lipozene prior to these symptoms. Had lost 40 pounds in 2 months, intentionally. He also had new anemia and reported melena. Patient advised to avoid all weight loss or herbal medications, avoid extreme low carb diet. Multiple labs ordered but unfortunately patient did not have them done even with multiple requests. He states he felt better and had too many other things to deal with.   Monday patient developed acute onset ruq pain with vomiting, itching and  Jaundice as well. Seen in ED at [facility]. abd u/s performed with NEW dilation of CBD from 4.8 to 7m since 02/2016. Distal CBD could not be seen. Layering nonshadowing sludge in the gallbladder. Tbili 3.5, AP 613, AST 87, ALT 121. We sent him for stat MRCP to evaluate bile duct but patient could not tolerate. STAT CT ABD done yesterday showing CBD 160m intrahepatic ductal dilation as well, enhancing nodular lesion in distal CBD at ampulla 12 X 1123mgb distended to 5.2cm, no gallstones by CT. Pancreas normal.   Patient worked today. He vomited after breakfast. Some ruq pain and fullness constant, no sharp pain like Monday. More nagging type. . BM regular. No melena, brbpr.   Current Outpatient Prescriptions  Medication Sig Dispense Refill  . oxyCODONE-acetaminophen (PERCOCET) 5-325 MG tablet Take 1-2  tablets by mouth every 4 (four) hours as needed for severe pain.    . zMarland Kitchenlpidem (AMBIEN) 10 MG tablet Take 1 tablet (10 mg total) by mouth at bedtime as needed for sleep. Take at least 7 hours before planned awakening. 14 tablet 0   No current facility-administered medications for this visit.     Allergies as of 07/05/2016 - Review Complete 07/05/2016  Allergen Reaction Noted  . Poison oak extract  07/05/2016    Past Medical History:  Diagnosis Date  . Anxiety   . GERD (gastroesophageal reflux disease)   . History of kidney stones     Past Surgical History:  Procedure Laterality Date  . none      Family History  Problem Relation Age of Onset  . Liver disease Neg Hx     Social History   Social History  . Marital status: Single    Spouse name: N/A  . Number of children: N/A  . Years of education: N/A   Occupational History  . Not on file.   Social History Main Topics  . Smoking status: Former Smoker    Packs/day: 1.00    Years: 24.00    Types: Cigarettes    Quit date: 07/06/2011  . Smokeless tobacco: Former UseSystems developer Types: Snuff    Quit date: 07/06/1982  . Alcohol use Yes     Comment: socially , moonshine once every 3 months  . Drug use: No  .  Sexual activity: Not on file   Other Topics Concern  . Not on file   Social History Narrative  . No narrative on file      ROS:  General: Negative for anorexia, weight loss, fever, chills, fatigue, weakness. Eyes: Negative for vision changes.  ENT: Negative for hoarseness, difficulty swallowing , nasal congestion. CV: Negative for chest pain, angina, palpitations, dyspnea on exertion, peripheral edema.  Respiratory: Negative for dyspnea at rest, dyspnea on exertion, cough, sputum, wheezing.  GI: See history of present illness. GU:  Negative for dysuria, hematuria, urinary incontinence, urinary frequency, nocturnal urination.  MS: Negative for joint pain, low back pain.  Derm: Negative for rash. + itching.   Neuro: Negative for weakness, abnormal sensation, seizure, frequent headaches, memory loss, confusion.  Psych: Negative for anxiety, depression, suicidal ideation, hallucinations.  Endo: Negative for unusual weight change.  Heme: Negative for bruising or bleeding. Allergy: Negative for rash or hives.    Physical Examination:  BP 131/84   Pulse 70   Temp 97 F (36.1 C) (Oral)   Ht 5' 11" (1.803 m)   Wt 260 lb 6.4 oz (118.1 kg)   BMI 36.32 kg/m    General: Well-nourished, well-developed in no acute distress.  Head: Normocephalic, atraumatic.   Eyes: Conjunctiva pink, no icterus. Mouth: Oropharyngeal mucosa moist and pink , no lesions erythema or exudate. Neck: Supple without thyromegaly, masses, or lymphadenopathy.  Lungs: Clear to auscultation bilaterally.  Heart: Regular rate and rhythm, no murmurs rubs or gallops.  Abdomen: Bowel sounds are normal, fullness in ruq with moderate tenderness, nondistended, no hepatosplenomegaly or masses, no abdominal bruits or    hernia , no rebound or guarding.   Rectal: not performed Extremities: No lower extremity edema. No clubbing or deformities.  Neuro: Alert and oriented x 4 , grossly normal neurologically.  Skin: Warm and dry, no rash or jaundice.   Psych: Alert and cooperative, normal mood and affect.  Labs: Lab Results  Component Value Date   WBC 7.1 07/04/2016   HGB 11.8 (L) 07/04/2016   HCT 36.0 (L) 07/04/2016   MCV 83.5 07/04/2016   PLT 393 07/04/2016   Lab Results  Component Value Date   CREATININE 0.76 07/05/2016   BUN 12 07/05/2016   NA 135 07/05/2016   K 3.9 07/05/2016   CL 99 (L) 07/05/2016   CO2 27 07/05/2016   Lab Results  Component Value Date   ALT 108 (H) 07/05/2016   AST 70 (H) 07/05/2016   ALKPHOS 574 (H) 07/05/2016   BILITOT 4.2 (H) 07/05/2016   No results found for: INR, PROTIME Lab Results  Component Value Date   LIPASE 40 07/04/2016     Imaging Studies: Ct Pancreas Abd W/wo  Result Date:  07/04/2016 CLINICAL DATA:  Dilated biliary tree ultrasound. RIGHT quadrant pain. Elevated bilirubin. EXAM: CT ABDOMEN WITHOUT AND WITH CONTRAST TECHNIQUE: Multidetector CT imaging of the abdomen was performed following the standard protocol before and following the bolus administration of intravenous contrast. CONTRAST:  160m ISOVUE-300 IOPAMIDOL (ISOVUE-300) INJECTION 61% COMPARISON:  Ultrasound 07/02/2016, CT 01/02/2014 FINDINGS: Lower chest: Lung bases are clear. Hepatobiliary: Moderate intrahepatic duct dilatation. Marked extrahepatic duct dilatation. The common hepatic duct measures 17 mm. The common bile duct measures 16 mm. There is an enhancing nodular lesion within the distal common bile duct at the level of the ampulla measuring 12 mm x 11 mm. (Image 69, series 9). There is no focal lesion within the liver. The gallbladder is distended to 5.2  cm. The gallbladder contains no gallstones by CT. Pancreas: There is no pancreatic duct dilatation. No pancreatic inflammation. Spleen: Normal spleen Adrenals/urinary tract: Adrenal glands and kidneys are normal. The ureters and bladder normal. Stomach/Bowel: Stomach, duodenum limited view of the colon is unremarkable. Vascular/Lymphatic: Abdominal or is normal. No retroperitoneal adenopathy. Other: No free fluid. Musculoskeletal: No aggressive osseous lesion. IMPRESSION: 1. Severe extrahepatic duct dilatation and moderate intrahepatic duct dilatation secondary to obstructing lesion in the distal common bile duct at the level of the ampulla. 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 Recommend ERCP for tissue sampling and relief of the biliary obstructive pattern. 2. Distention of the gallbladder likely relates to obstruction of the common bile duct. 3. No evidence of pancreatic lesion or acute pancreatitis. 4. No lymphadenopathy. These results will be called to the ordering  clinician or representative by the Radiologist Assistant, and communication documented in the PACS or zVision Dashboard. Electronically Signed   By: Suzy Bouchard M.D.   On: 07/04/2016 18:14

## 2016-07-06 NOTE — Transfer of Care (Signed)
Immediate Anesthesia Transfer of Care Note  Patient: Luis Frey  Procedure(s) Performed: Procedure(s) with comments: ENDOSCOPIC RETROGRADE CHOLANGIOPANCREATOGRAPHY (ERCP) (N/A) - 130  BILIARY STENT PLACEMENT (N/A)  Patient Location: PACU  Anesthesia Type:General  Level of Consciousness: awake, oriented and patient cooperative  Airway & Oxygen Therapy: Patient Spontanous Breathing  Post-op Assessment: Report given to RN and Post -op Vital signs reviewed and stable  Post vital signs: Reviewed and stable  Last Vitals:  Vitals:   07/06/16 1248 07/06/16 1514  BP:  112/61  Pulse:    Resp:  (!) 22  Temp: 36.6 C 36.6 C    Last Pain:  Vitals:   07/06/16 1248  TempSrc: Oral      Patients Stated Pain Goal: 2 (88/33/74 4514)  Complications: No apparent anesthesia complications

## 2016-07-06 NOTE — Anesthesia Preprocedure Evaluation (Signed)
Anesthesia Evaluation  Patient identified by MRN, date of birth, ID band Patient awake    Reviewed: Allergy & Precautions, NPO status , Patient's Chart, lab work & pertinent test results  Airway        Dental   Pulmonary neg pulmonary ROS, former smoker,    breath sounds clear to auscultation       Cardiovascular negative cardio ROS   Rhythm:Regular Rate:Normal     Neuro/Psych PSYCHIATRIC DISORDERS Anxiety    GI/Hepatic GERD  ,  Endo/Other    Renal/GU      Musculoskeletal   Abdominal   Peds  Hematology  (+) anemia ,   Anesthesia Other Findings   Reproductive/Obstetrics                             Anesthesia Physical Anesthesia Plan  ASA: II  Anesthesia Plan: General   Post-op Pain Management:    Induction: Intravenous, Rapid sequence and Cricoid pressure planned  Airway Management Planned: Oral ETT  Additional Equipment:   Intra-op Plan:   Post-operative Plan: Extubation in OR  Informed Consent: I have reviewed the patients History and Physical, chart, labs and discussed the procedure including the risks, benefits and alternatives for the proposed anesthesia with the patient or authorized representative who has indicated his/her understanding and acceptance.     Plan Discussed with:   Anesthesia Plan Comments:         Anesthesia Quick Evaluation

## 2016-07-06 NOTE — Progress Notes (Signed)
Awake. Wants to go home. Wants to see wife. Ginger-ale given to drink. Tolerated well. Swallowing without difficulty. Voices  No c/o at this time.

## 2016-07-06 NOTE — Discharge Instructions (Addendum)
Standard ERCP postoperative instructions given  Clear liquids for supper this evening. If doing okay in the morning, may advance diet as tolerated  Hepatic profile and CBC on Monday, 07/09/2016....ordered and copy of order to patient  Biopsy results should be back the first of next week. I will be in touch with the same.    PATIENT INSTRUCTIONS POST-ANESTHESIA  IMMEDIATELY FOLLOWING SURGERY:  Do not drive or operate machinery for the first twenty four hours after surgery.  Do not make any important decisions for twenty four hours after surgery or while taking narcotic pain medications or sedatives.  If you develop intractable nausea and vomiting or a severe headache please notify your doctor immediately.  FOLLOW-UP:  Please make an appointment with your surgeon as instructed. You do not need to follow up with anesthesia unless specifically instructed to do so.  WOUND CARE INSTRUCTIONS (if applicable):  Keep a dry clean dressing on the anesthesia/puncture wound site if there is drainage.  Once the wound has quit draining you may leave it open to air.  Generally you should leave the bandage intact for twenty four hours unless there is drainage.  If the epidural site drains for more than 36-48 hours please call the anesthesia department.  QUESTIONS?:  Please feel free to call your physician or the hospital operator if you have any questions, and they will be happy to assist you.        Endoscopic Retrograde Cholangiopancreatogram, Care After This sheet gives you information about how to care for yourself after your procedure. Your health care provider may also give you more specific instructions. If you have problems or questions, contact your health care provider. What can I expect after the procedure? After the procedure, it is common to have:  Soreness in your throat.  Nausea.  Bloating.  Dizziness.  Tiredness (fatigue). Follow these instructions at home:  Take  over-the-counter and prescription medicines only as told by your health care provider.  Do not drive for 24 hours if you were given a medicine to help you relax (sedative) during your procedure. Have someone stay with you for 24 hours after the procedure.  Return to your normal activities as told by your health care provider. Ask your health care provider what activities are safe for you.  Return to eating what you normally do as soon as you feel well enough or as told by your health care provider.  Keep all follow-up visits as told by your health care provider. This is important. Contact a health care provider if:  You have pain in your abdomen that does not get better with medicine.  You develop signs of infection, such as:  Chills.  Feeling unwell. Get help right away if:  You have difficulty swallowing.  You have worsening pain in your throat, chest, or abdomen.  You vomit bright red blood or a substance that looks like coffee grounds.  You have bloody or very black stools.  You have a fever.  You have a sudden increase in swelling (bloating) in your abdomen. Summary  After the procedure, it is common to feel tired and to have some discomfort in your throat.  Contact your health care provider if you have signs of infection--such as chills or feeling unwell--or if you have pain that does not improve with medicine.  Get help right away if you have trouble swallowing, worsening pain, bloody or black vomit, bloody or black stools, a fever, or increased swelling in your abdomen.  Keep  all follow-up visits as told by your health care provider. This is important. This information is not intended to replace advice given to you by your health care provider. Make sure you discuss any questions you have with your health care provider. Document Released: 11/19/2012 Document Revised: 12/19/2015 Document Reviewed: 12/19/2015 Elsevier Interactive Patient Education  2017 [employer].

## 2016-07-07 ENCOUNTER — Encounter: Payer: Self-pay | Admitting: Internal Medicine

## 2016-07-07 NOTE — Progress Notes (Signed)
Brief procedure note. ERCP with biliary sphincterotomy, stone extraction and cholangioscopy/biopsy. 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 A 10 mm 40 mm covered expandable metallic stent placed.  Patient tolerated the procedure well. Blood loss minimum.  Please see full dictated report for details.

## 2016-07-10 NOTE — Op Note (Signed)
NAMEANUJ, SUMMONS                ACCOUNT NO.:  [id number]  MEDICAL RECORD NO.:  [id number]  LOCATION:                                 FACILITY:  PHYSICIAN:  R. Garfield Cornea, MD Baker:  12-19-1970  DATE OF PROCEDURE:  07/06/2016 DATE OF DISCHARGE:                              OPERATIVE REPORT   PROCEDURES:  ERCP with biliary sphincterotomy, stone extraction, ampullary biopsy, cholangioscopy, and expandable covered metallic stent.  INDICATIONS FOR PROCEDURE:  A 46 year old gentleman, recently presented with right upper quadrant abdominal pain and jaundice.  LFTs abnormal in a cholestatic pattern.  Recent right upper quadrant ultrasound demonstrates a common bile duct dilated to 17 mm without obvious other abnormality (bile duct diameter was 4.8 mm in January of this year). Pancreatic protocol CT demonstrates about a 12 mm mass at the level of the ampulla with marked upstream intra and extrahepatic biliary dilation.  No obvious stone disease.  ERCP is now being done to decompress the biliary tree and perform further evaluation.  Risks, benefits, limitations, alternatives, and imponderables have been discussed.  Specifically discussed risks of procedure including a 1 in 10 chance pancreatitis, reaction medication, bleeding potential for a failed procedure and the need for subsequent evaluation.  Questions answered.  Both the patient and his wife are agreeable.  PROCEDURE NOTE:  O2 saturation, blood pressure, pulse, respirations were monitored throughout the entire procedure.  General endotracheal anesthesia induced by Dr. Duwayne Heck and associates.  INSTRUMENT: 1. Pentax video chip system. 2. Levaquin 500 mg IV for the procedure. 3. Wandra Mannan, [facility] representative was present for the     case.  FINDINGS:  Cursory examination to the distal esophagus, stomach, and duodenum to the second portion revealed a bulging ampulla with a large clot  protruding from the ampullary orifice.  There was no active bleeding.  The scope was pulled back to the short position 55 cm from the ampullary orifice.  Scout film was taken.  Using the 66 Autotome, the ampulla was approached with guidewire palpation almost immediately achieved deep biliary cannulation.  Cholangiogram was performed.  There was marked dilation of the intra and extrahepatic biliary tree.  There was irregularity and stricture involving the very distal common bile duct.  I railed off the sphincterotome and railed on a biliary dredging Balloon;  railed it to the confluence and inflated it to accommodate the size of the duct.  Balloon occlusion cholangiogram was performed.  With this maneuver, some small cholesterol stone material and sludge was delivered through the ampullary orifice around the clot.  Utilizing the autotome at the 12 o'clock position under guidewire endoscopic control, a 1 cm sphincterotomy was performed; this was done without difficulty given a nice intramural segment.  There was no bleeding related to this maneuver.  Subsequently, the SpyGlass cholangioscope was introduced into the common bile duct and railed to the level of the confluence.  In fact, I went on further and probably reached the termination of the right hepatic duct as well.  Then, I slowly examined the distal biliary tree by pulling back.  The biliary tree at this level appeared normal; however, the distal 1-2 cm  of the bile duct was markedly abnormal with denuded somewhat neoplastic appearing mucosa observed.  I railed off the cholangioscope and advanced standard biopsy forceps and took multiple biopsies of the abnormal ampulla.  This was done without difficulty. Minimal bleeding from this maneuver was seen.  Finally, I reintroduced the guidewire deep in the biliary tree where the Sphincterotome;  sphinctertome was railed off and a 10 mm x 40 mm covered expandable metallic stent was  advanced.  It was railed into position with fluoroscopic and endoscopic control.  It was deployed in excellent position with excellent drainage.  The pancreatic duct was not manipulated/injected during this procedure.  Total fluoroscopy time 2 minutes 17 seconds.  The patient was taken to PACU in stable condition.  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  Clotted ampullary orifice without active Bleeding; status post biliary sphincterotomy with stone extraction; status post diagnostic cholangioscopy, status post ampullary biopsy and Finally,  expandable covered metallic stent placement.  RECOMMENDATIONS:  If the patient does well in PACU, we will allow him to go home.  Clear liquid diet this evening.  Advance as tolerated tomorrow.  Repeat labs the first of the week.  Follow up on pathology.  Tertiary referral is anticipated.     Bridgette Habermann, MD [facility]     RMR/MEDQ  D:  07/06/2016  T:  07/06/2016  Job:  [id number]

## 2016-07-11 ENCOUNTER — Telehealth: Payer: Self-pay

## 2016-07-11 NOTE — Telephone Encounter (Signed)
Pt is calling to see about the results from his bx from the ERCP. Please advise

## 2016-07-12 ENCOUNTER — Other Ambulatory Visit: Payer: Self-pay

## 2016-07-12 ENCOUNTER — Telehealth: Payer: Self-pay | Admitting: Internal Medicine

## 2016-07-12 DIAGNOSIS — C801 Malignant (primary) neoplasm, unspecified: Secondary | ICD-10-CM

## 2016-07-12 NOTE — Telephone Encounter (Signed)
Called pts wife and left voicemail that rx was at the front desk. Copy made and sent to be scanned. Wife called back and is aware of rx and will pick it up.

## 2016-07-12 NOTE — Telephone Encounter (Signed)
Dr.Rourk, can we give the pt an rx for pain. He is going to be seen by [facility] next Tuesday.

## 2016-07-12 NOTE — Telephone Encounter (Signed)
see results note.

## 2016-07-12 NOTE — Telephone Encounter (Signed)
Rx written and given to JL

## 2016-07-12 NOTE — Telephone Encounter (Signed)
Pt's wife, Kenney Houseman, called to ask about patient getting something for pain. I seen in epic that oxycodone/percocet was listed in the med list, but didn't have anything up front for him to pick up. JL wasn't available so the wife asked for JL to call her either at work [employer] social research officer) [phone] or on her cell [phone]

## 2016-07-12 NOTE — Telephone Encounter (Signed)
I would say something like Percocet 5/325 mg tablets- dispense 40;  one every 4 hours when necessary pain. Of course, someone there will have to write the prescription.

## 2016-07-12 NOTE — Telephone Encounter (Signed)
Randall Hiss, will you write rx per RMR recommendations?

## 2016-07-12 NOTE — Telephone Encounter (Signed)
Wife picked up RX

## 2016-07-12 NOTE — Telephone Encounter (Signed)
Pt is aware of results from RMR yesterday.

## 2016-07-13 ENCOUNTER — Encounter (HOSPITAL_COMMUNITY): Payer: Self-pay | Admitting: Internal Medicine

## 2016-07-16 ENCOUNTER — Telehealth: Payer: Self-pay

## 2016-07-16 NOTE — Telephone Encounter (Signed)
Pt called to inform us that [facility] is out of his network, He is still going to his appointment tomorrow and see if they can help him with some kind of payment plan or something.

## 2016-07-17 NOTE — Telephone Encounter (Signed)
Communication noted.  

## 2016-08-08 HISTORY — PX: WHIPPLE PROCEDURE: SHX2667

## 2016-08-12 DIAGNOSIS — K8689 Other specified diseases of pancreas: Secondary | ICD-10-CM

## 2016-08-12 HISTORY — DX: Other specified diseases of pancreas: K86.89

## 2016-08-12 HISTORY — PX: SUPERIOR MESTENTERIC ARTERY STENT: SHX2461

## 2016-08-19 ENCOUNTER — Emergency Department (HOSPITAL_COMMUNITY)
Admission: EM | Admit: 2016-08-19 | Discharge: 2016-08-19 | Disposition: A | Discharge status: Other Acute Inpatient Hospital | Payer: PRIVATE HEALTH INSURANCE | Attending: Emergency Medicine | Admitting: Emergency Medicine

## 2016-08-19 ENCOUNTER — Emergency Department (HOSPITAL_COMMUNITY): Payer: PRIVATE HEALTH INSURANCE

## 2016-08-19 ENCOUNTER — Encounter (HOSPITAL_COMMUNITY): Payer: Self-pay | Admitting: *Deleted

## 2016-08-19 DIAGNOSIS — Y69 Unspecified misadventure during surgical and medical care: Secondary | ICD-10-CM | POA: Diagnosis not present

## 2016-08-19 DIAGNOSIS — T8132XA Disruption of internal operation (surgical) wound, not elsewhere classified, initial encounter: Secondary | ICD-10-CM | POA: Diagnosis present

## 2016-08-19 DIAGNOSIS — Z9689 Presence of other specified functional implants: Secondary | ICD-10-CM | POA: Diagnosis not present

## 2016-08-19 DIAGNOSIS — Z87891 Personal history of nicotine dependence: Secondary | ICD-10-CM | POA: Insufficient documentation

## 2016-08-19 DIAGNOSIS — R58 Hemorrhage, not elsewhere classified: Secondary | ICD-10-CM

## 2016-08-19 DIAGNOSIS — K91840 Postprocedural hemorrhage and hematoma of a digestive system organ or structure following a digestive system procedure: Secondary | ICD-10-CM | POA: Diagnosis not present

## 2016-08-19 LAB — CBC WITH DIFFERENTIAL/PLATELET
BASOS ABS: 0 10*3/uL (ref 0.0–0.1)
Basophils Relative: 0 %
EOS PCT: 1 %
Eosinophils Absolute: 0.2 10*3/uL (ref 0.0–0.7)
HEMATOCRIT: 30.9 % — AB (ref 39.0–52.0)
Hemoglobin: 10.1 g/dL — ABNORMAL LOW (ref 13.0–17.0)
LYMPHS ABS: 6.2 10*3/uL — AB (ref 0.7–4.0)
LYMPHS PCT: 32 %
MCH: 26.2 pg (ref 26.0–34.0)
MCHC: 32.7 g/dL (ref 30.0–36.0)
MCV: 80.3 fL (ref 78.0–100.0)
MONO ABS: 1.6 10*3/uL — AB (ref 0.1–1.0)
MONOS PCT: 8 %
NEUTROS ABS: 11.7 10*3/uL — AB (ref 1.7–7.7)
Neutrophils Relative %: 59 %
PLATELETS: 725 10*3/uL — AB (ref 150–400)
RBC: 3.85 MIL/uL — ABNORMAL LOW (ref 4.22–5.81)
RDW: 15.4 % (ref 11.5–15.5)
WBC: 19.7 10*3/uL — ABNORMAL HIGH (ref 4.0–10.5)

## 2016-08-19 LAB — PREPARE RBC (CROSSMATCH)

## 2016-08-19 LAB — COMPREHENSIVE METABOLIC PANEL
ALK PHOS: 204 U/L — AB (ref 38–126)
ALT: 19 U/L (ref 17–63)
ANION GAP: 13 (ref 5–15)
AST: 18 U/L (ref 15–41)
Albumin: 2.7 g/dL — ABNORMAL LOW (ref 3.5–5.0)
BILIRUBIN TOTAL: 0.4 mg/dL (ref 0.3–1.2)
BUN: 19 mg/dL (ref 6–20)
CALCIUM: 9 mg/dL (ref 8.9–10.3)
CO2: 26 mmol/L (ref 22–32)
CREATININE: 0.83 mg/dL (ref 0.61–1.24)
Chloride: 96 mmol/L — ABNORMAL LOW (ref 101–111)
GFR calc non Af Amer: >60 mL/min (ref >60)
Glucose, Bld: 121 mg/dL — ABNORMAL HIGH (ref 65–99)
Potassium: 4.2 mmol/L (ref 3.5–5.1)
Sodium: 135 mmol/L (ref 135–145)
TOTAL PROTEIN: 7.3 g/dL (ref 6.5–8.1)

## 2016-08-19 LAB — PROTIME-INR
INR: 1.12
Prothrombin Time: 14.5 seconds (ref 11.4–15.2)

## 2016-08-19 MED ORDER — IOPAMIDOL (ISOVUE-300) INJECTION 61%: 100.0000 mL | Freq: Once | INTRAVENOUS | Status: DC | PRN

## 2016-08-19 MED ORDER — SODIUM CHLORIDE 0.9 % IV BOLUS (SEPSIS)
1000.0000 mL | Freq: Once | INTRAVENOUS | Status: AC
  Administered 2016-08-19: 1000 mL via INTRAVENOUS

## 2016-08-19 MED ORDER — TRANEXAMIC ACID 1000 MG/10ML IV SOLN
1000.0000 mg | Freq: Once | INTRAVENOUS | Status: AC
  Administered 2016-08-19: 1000 mg via INTRAVENOUS

## 2016-08-19 MED ORDER — FENTANYL CITRATE (PF) 100 MCG/2ML IJ SOLN
50.0000 ug | Freq: Once | INTRAMUSCULAR | Status: AC
  Administered 2016-08-19: 50 ug via INTRAVENOUS
  Filled 2016-08-19: qty 2

## 2016-08-19 MED ORDER — SODIUM CHLORIDE 0.9 % IV SOLN
10.0000 mL/h | Freq: Once | INTRAVENOUS | Status: AC
  Administered 2016-08-19: 10 mL/h via INTRAVENOUS

## 2016-08-19 NOTE — ED Notes (Addendum)
[facility] at bedside.

## 2016-08-19 NOTE — ED Notes (Signed)
JP drains emptied of approximately 69ml of bright red blood. JP drains charged again. Large clots in both JP drains that are unable to be removed.

## 2016-08-19 NOTE — ED Triage Notes (Addendum)
Pt had whipple procedure done at [facility] 10 days ago and started having bleeding today about 30 mins ago. Bleeding around tubes from lower abdomen. Ems reports hr 130, bp 115/90, approx 1L of blood lost at home prior to EMS arrival.  Ems started iv in right a/c.

## 2016-08-19 NOTE — ED Notes (Addendum)
Pt gave verbal consent to have blood transfusion. Gabriel Earing, RN and Randie Heinz, RN as verbal  witnesses.

## 2016-08-19 NOTE — ED Notes (Signed)
Give 2 units of PRBC via rapid transfuser per Dr. Ammie Ferrier verbal order.

## 2016-08-19 NOTE — ED Notes (Signed)
JP drains emptied about 97ml of bright red blood. Large clots in both JP drains that are unable to be drained. JP drains charged afterwards.

## 2016-08-19 NOTE — ED Provider Notes (Addendum)
[place] DEPT Provider Note   CSN: [id number] Arrival date & time: 08/19/16  1702   History   Chief Complaint Chief Complaint  Patient presents with  . Wound Dehiscence    HPI Luis Frey is a 46 y.o. male.  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 He was released from [facility] and since that time he has had nothing but a cloudy type drainage from his 2 JP drains, one in the right mid abdomen and one in the left midabdomen. He reports that today he had increased abdominal pain and then started to hemorrhage bright red blood into both the JP drains as well as around the drain on the left. Paramedics were called and found the patient to be in significant distress on his couch covered in blood as well as the blankets around him which were soaked in blood. The patient was tachycardic but not hypotensive. He was pale appearing with ongoing abdominal pain. They transported immediately to the Center for stabilizing care. The patient is currently taking an anticoagulant, he is unsure why he is doing this but thinks it is to prevent blood clots. The patient has been very immobile since the procedure.  Past Medical History:  Diagnosis Date  . Anxiety   . GERD (gastroesophageal reflux disease)   . History of kidney stones     Patient Active Problem List   Diagnosis Date Noted  . Dilated bile duct 07/05/2016  . Biliary obstruction 07/05/2016  . Abnormal LFTs 03/21/2016  . Anemia 03/21/2016  . Melena 03/21/2016    Past Surgical History:  Procedure Laterality Date  . BILIARY STENT PLACEMENT N/A 07/06/2016   Procedure: BILIARY STENT PLACEMENT;  Surgeon: Daneil Dolin, MD;  Location[facility];  Service: Endoscopy;  Laterality: N/A;  . BIOPSY  07/06/2016   Procedure: BIOPSY ampullary;  Surgeon: Daneil Dolin, MD;  Location: [facility];  Service: Endoscopy;;  . ERCP N/A 07/06/2016   Procedure: ENDOSCOPIC RETROGRADE CHOLANGIOPANCREATOGRAPHY (ERCP);  Surgeon: Daneil Dolin, MD;  Location: [facility];  Service: Endoscopy;  Laterality: N/A;  130   . none    . SPHINCTEROTOMY  07/06/2016   Procedure: SPHINCTEROTOMY;  Surgeon: Daneil Dolin, MD;  Location: [facility];  Service: Endoscopy;;  . WHIPPLE PROCEDURE  08/08/2016   Dr. Crisoforo Oxford at [facility] Medications    Prior to Admission medications   Medication Sig Start Date End Date Taking? Authorizing Provider  enoxaparin (LOVENOX) 40 MG/0.4ML injection Inject 40 mg into the skin daily. 08/18/16 09/01/16 Yes [provider]  ondansetron (ZOFRAN) 4 MG tablet Take 4 mg by mouth every 8 (eight) hours as needed for nausea or vomiting.    Yes [provider]  Oxycodone HCl 10 MG TABS Take 5-10 mg by mouth every 4 (four) hours as needed. 08/18/16 08/23/16 Yes [provider]  zolpidem (AMBIEN) 10 MG tablet Take 1 tablet (10 mg total) by mouth at bedtime as needed for sleep. Take at least 7 hours before planned awakening. 07/05/16 08/19/16 Yes Mahala Menghini, PA-C    Family History Family History  Problem Relation Age of Onset  . Liver disease Neg Hx     Social History Social History  Substance Use Topics  . Smoking status: Former Smoker    Packs/day:  1.00    Years: 24.00    Types: Cigarettes    Quit date: 07/06/2011  . Smokeless tobacco: Former Systems developer    Types: Snuff    Quit date: 07/06/1982  . Alcohol use Yes     Comment: socially , moonshine once every 3 months     Allergies   Poison oak extract   Review of Systems Review of Systems  All other systems reviewed and are negative.    Physical Exam Updated Vital Signs BP (!) 149/77   Pulse (!) 110   Temp 98.4 F (36.9 C) (Oral)   Resp 17   SpO2 100%   Physical Exam  Constitutional: He appears well-developed and well-nourished. He appears distressed.   HENT:  Head: Normocephalic and atraumatic.  Mouth/Throat: Oropharynx is clear and moist. No oropharyngeal exudate.  Eyes: Conjunctivae and EOM are normal. Pupils are equal, round, and reactive to light. Right eye exhibits no discharge. Left eye exhibits no discharge. No scleral icterus.  Neck: Normal range of motion. Neck supple. No JVD present. No thyromegaly present.  Cardiovascular: Regular rhythm, normal heart sounds and intact distal pulses.  Exam reveals no gallop and no friction rub.   No murmur heard. Tachycardia to 120 bpm  Pulmonary/Chest: Effort normal and breath sounds normal. No respiratory distress. He has no wheezes. He has no rales.  Abdominal: Bowel sounds are normal. He exhibits distension. He exhibits no mass. There is tenderness. There is guarding.  The abdomen is tense, distended, diffusely tender  There are 2 JP drains both full of bright red blood, there is bright red blood coming from around the site on the left  Musculoskeletal: Normal range of motion. He exhibits no edema or tenderness.  Lymphadenopathy:    He has no cervical adenopathy.  Neurological: He is alert. Coordination normal.  The patient is able to move all 4 extremities though he does appear generally weak mildly fatigued. He is able to answer all my questions appropriately.  Skin: Skin is warm and dry. No rash noted. No erythema.  Psychiatric: He has a normal mood and affect. His behavior is normal.  Nursing note and vitals reviewed.   ED Treatments / Results  Labs (all labs ordered are listed, but only abnormal results are displayed) Labs Reviewed  CBC WITH DIFFERENTIAL/PLATELET - Abnormal; Notable for the following:       Result Value   WBC 19.7 (*)    RBC 3.85 (*)    Hemoglobin 10.1 (*)    HCT 30.9 (*)    Platelets 725 (*)    Neutro Abs 11.7 (*)    Lymphs Abs 6.2 (*)    Monocytes Absolute 1.6 (*)    All other components within normal limits  COMPREHENSIVE METABOLIC PANEL - Abnormal;  Notable for the following:    Chloride 96 (*)    Glucose, Bld 121 (*)    Albumin 2.7 (*)    Alkaline Phosphatase 204 (*)    All other components within normal limits  PROTIME-INR  I-STAT CG4 LACTIC ACID, ED  TYPE AND SCREEN  PREPARE RBC (CROSSMATCH)  ABO/RH   EKG  EKG Interpretation  Date/Time:  Sunday August 19 2016 17:06:57 EDT Ventricular Rate:  112 PR Interval:    QRS Duration: 85 QT Interval:  351 QTC Calculation: 480 R Axis:   80 Text Interpretation:  Sinus tachycardia Minimal ST depression, inferior leads Borderline prolonged QT interval Since last tracing rate faster Confirmed by Noemi Chapel [phone]) on 08/19/2016 5:25:46 PM  Radiology No results found.  Procedures Procedures (including critical care time)  Medications Ordered in ED Medications  iopamidol (ISOVUE-300) 61 % injection 100 mL (not administered)  sodium chloride 0.9 % bolus 1,000 mL (0 mLs Intravenous Stopped 08/19/16 1810)  0.9 %  sodium chloride infusion (0 mL/hr Intravenous Stopped 08/19/16 1820)  sodium chloride 0.9 % bolus 1,000 mL (1,000 mLs Intravenous Transfusing/Transfer 08/19/16 1843)  tranexamic acid (CYKLOKAPRON) 1,000 mg in sodium chloride 0.9 % 100 mL BOLUS (0 mg Intravenous Stopped 08/19/16 1802)  fentaNYL (SUBLIMAZE) injection 50 mcg (50 mcg Intravenous Given by Other 08/19/16 1743)     Initial Impression / Assessment and Plan / ED Course  I have reviewed the triage vital signs and the nursing notes.  Pertinent labs & imaging results that were available during my care of the patient were reviewed by me and considered in my medical decision making (see chart for details).    The patient is in acute distress, he has ongoing active bleeding, bedside ultrasound reveals minimal intraperitoneal fluid seen in Morison's pouch but no other intra-abdominal fluid at the usual sites on FAST exam. He has diffuse abdominal tenderness which would not be on syncopal after the surgery that he has had but  given the amount of bleeding I am increasingly concerned that he has an intra-abdominal hemorrhage that needs to be surgically contained. I have ordered immediate release O- blood for immediate transfusion on a rapid infuser, EMS has stayed at the bedside in case we need to transport emergently to [facility]. I'm waiting to hear back from the surgeons. The patient's blood pressure remains in a normal range though he does remain tachycardic this is improving as well. IV fluids are running through his second IV and I placed a third IV in the external jugular vein on the left.  CRITICAL CARE Performed by: Johnna Acosta Total critical care time: 35 minutes Critical care time was exclusive of separately billable procedures and treating other patients. Critical care was necessary to treat or prevent imminent or life-threatening deterioration. Critical care was time spent personally by me on the following activities: development of treatment plan with patient and/or surrogate as well as nursing, discussions with consultants, evaluation of patient's response to treatment, examination of patient, obtaining history from patient or surrogate, ordering and performing treatments and interventions, ordering and review of laboratory studies, ordering and review of radiographic studies, pulse oximetry and re-evaluation of patient's condition.  Care was discussed with Dr. Charlyn Minerva at [facility] who has accepted the pt in transfer to the ED at [facility].  EMS called for emergency transport.  TXA given RBC given on rapid infuser Bleeding has slowed. Patient and spouse updated Critically ill  Multiple calls to EMS including critical care [facility] transport, local rocking him [place] as well as [place] critical care transport and there is no available trucks. Due to the patient's critical status air care was requested for transport.   Final Clinical Impressions(s) / ED  Diagnoses   Final diagnoses:  Acute hemorrhage    New Prescriptions Discharge Medication List as of 08/19/2016  6:43 PM       Noemi Chapel, MD 08/19/16 1746    Noemi Chapel, MD 08/19/16 1905

## 2016-08-20 LAB — BPAM RBC
Blood Product Expiration Date: 201807272359
Blood Product Expiration Date: 201807272359
ISSUE DATE / TIME: 201807081713
ISSUE DATE / TIME: 201807081713
UNIT TYPE AND RH: 9500
UNIT TYPE AND RH: 9500

## 2016-08-20 LAB — TYPE AND SCREEN
ABO/RH(D): O POS
Antibody Screen: NEGATIVE
UNIT DIVISION: 0
Unit division: 0

## 2016-10-29 ENCOUNTER — Emergency Department (HOSPITAL_COMMUNITY): Payer: PRIVATE HEALTH INSURANCE

## 2016-10-29 ENCOUNTER — Encounter (HOSPITAL_COMMUNITY): Payer: Self-pay

## 2016-10-29 ENCOUNTER — Observation Stay (HOSPITAL_COMMUNITY)
Admission: EM | Admit: 2016-10-29 | Discharge: 2016-10-30 | DRG: 871 | Disposition: A | Discharge status: Home / Self Care | Payer: PRIVATE HEALTH INSURANCE | Attending: Internal Medicine | Admitting: Internal Medicine

## 2016-10-29 DIAGNOSIS — K219 Gastro-esophageal reflux disease without esophagitis: Secondary | ICD-10-CM | POA: Diagnosis present

## 2016-10-29 DIAGNOSIS — F1210 Cannabis abuse, uncomplicated: Secondary | ICD-10-CM

## 2016-10-29 DIAGNOSIS — C241 Malignant neoplasm of ampulla of Vater: Secondary | ICD-10-CM | POA: Diagnosis present

## 2016-10-29 DIAGNOSIS — G8929 Other chronic pain: Secondary | ICD-10-CM | POA: Diagnosis present

## 2016-10-29 DIAGNOSIS — F419 Anxiety disorder, unspecified: Secondary | ICD-10-CM | POA: Diagnosis present

## 2016-10-29 DIAGNOSIS — R945 Abnormal results of liver function studies: Secondary | ICD-10-CM | POA: Diagnosis present

## 2016-10-29 DIAGNOSIS — J690 Pneumonitis due to inhalation of food and vomit: Secondary | ICD-10-CM

## 2016-10-29 DIAGNOSIS — R0789 Other chest pain: Secondary | ICD-10-CM | POA: Diagnosis present

## 2016-10-29 DIAGNOSIS — Z79899 Other long term (current) drug therapy: Secondary | ICD-10-CM

## 2016-10-29 DIAGNOSIS — T17908D Unspecified foreign body in respiratory tract, part unspecified causing other injury, subsequent encounter: Secondary | ICD-10-CM

## 2016-10-29 DIAGNOSIS — E43 Unspecified severe protein-calorie malnutrition: Secondary | ICD-10-CM | POA: Diagnosis present

## 2016-10-29 DIAGNOSIS — G893 Neoplasm related pain (acute) (chronic): Secondary | ICD-10-CM

## 2016-10-29 DIAGNOSIS — Z90411 Acquired partial absence of pancreas: Secondary | ICD-10-CM

## 2016-10-29 DIAGNOSIS — A419 Sepsis, unspecified organism: Principal | ICD-10-CM

## 2016-10-29 DIAGNOSIS — T17908A Unspecified foreign body in respiratory tract, part unspecified causing other injury, initial encounter: Secondary | ICD-10-CM | POA: Diagnosis not present

## 2016-10-29 DIAGNOSIS — Z8701 Personal history of pneumonia (recurrent): Secondary | ICD-10-CM

## 2016-10-29 DIAGNOSIS — R509 Fever, unspecified: Secondary | ICD-10-CM

## 2016-10-29 DIAGNOSIS — Z6829 Body mass index (BMI) 29.0-29.9, adult: Secondary | ICD-10-CM | POA: Diagnosis not present

## 2016-10-29 DIAGNOSIS — Z87891 Personal history of nicotine dependence: Secondary | ICD-10-CM

## 2016-10-29 DIAGNOSIS — R7989 Other specified abnormal findings of blood chemistry: Secondary | ICD-10-CM | POA: Diagnosis present

## 2016-10-29 HISTORY — DX: Malignant neoplasm of ampulla of Vater: C24.1

## 2016-10-29 LAB — I-STAT TROPONIN, ED
Troponin i, poc: 0 ng/mL (ref 0.00–0.08)
Troponin i, poc: 0 ng/mL (ref 0.00–0.08)

## 2016-10-29 LAB — COMPREHENSIVE METABOLIC PANEL
ALK PHOS: 189 U/L — AB (ref 38–126)
ALT: 16 U/L — ABNORMAL LOW (ref 17–63)
ANION GAP: 12 (ref 5–15)
AST: 44 U/L — AB (ref 15–41)
Albumin: 3.3 g/dL — ABNORMAL LOW (ref 3.5–5.0)
BUN: 14 mg/dL (ref 6–20)
CO2: 26 mmol/L (ref 22–32)
Calcium: 9.3 mg/dL (ref 8.9–10.3)
Chloride: 99 mmol/L — ABNORMAL LOW (ref 101–111)
Creatinine, Ser: 0.83 mg/dL (ref 0.61–1.24)
GFR calc Af Amer: >60 mL/min (ref >60)
GFR calc non Af Amer: >60 mL/min (ref >60)
GLUCOSE: 92 mg/dL (ref 65–99)
POTASSIUM: 3.6 mmol/L (ref 3.5–5.1)
SODIUM: 137 mmol/L (ref 135–145)
Total Bilirubin: 0.5 mg/dL (ref 0.3–1.2)
Total Protein: 7.6 g/dL (ref 6.5–8.1)

## 2016-10-29 LAB — I-STAT CG4 LACTIC ACID, ED
LACTIC ACID, VENOUS: 3.1 mmol/L — AB (ref 0.5–1.9)
Lactic Acid, Venous: 1.53 mmol/L (ref 0.5–1.9)

## 2016-10-29 LAB — CBC WITH DIFFERENTIAL/PLATELET
Basophils Absolute: 0 10*3/uL (ref 0.0–0.1)
Basophils Relative: 0 %
EOS PCT: 0 %
Eosinophils Absolute: 0 10*3/uL (ref 0.0–0.7)
HEMATOCRIT: 37.5 % — AB (ref 39.0–52.0)
Hemoglobin: 11.1 g/dL — ABNORMAL LOW (ref 13.0–17.0)
Lymphocytes Relative: 4 %
Lymphs Abs: 0.3 10*3/uL — ABNORMAL LOW (ref 0.7–4.0)
MCH: 23.8 pg — AB (ref 26.0–34.0)
MCHC: 29.6 g/dL — AB (ref 30.0–36.0)
MCV: 80.3 fL (ref 78.0–100.0)
MONO ABS: 0 10*3/uL — AB (ref 0.1–1.0)
Monocytes Relative: 0 %
NEUTROS ABS: 5.7 10*3/uL (ref 1.7–7.7)
Neutrophils Relative %: 95 %
Platelets: 491 10*3/uL — ABNORMAL HIGH (ref 150–400)
RBC: 4.67 MIL/uL (ref 4.22–5.81)
RDW: 18 % — AB (ref 11.5–15.5)
WBC: 5.9 10*3/uL (ref 4.0–10.5)

## 2016-10-29 LAB — LIPASE, BLOOD: Lipase: 32 U/L (ref 11–51)

## 2016-10-29 MED ORDER — MORPHINE SULFATE (PF) 4 MG/ML IV SOLN
INTRAVENOUS | Status: AC
  Filled 2016-10-29: qty 1

## 2016-10-29 MED ORDER — IOPAMIDOL (ISOVUE-300) INJECTION 61%
100.0000 mL | Freq: Once | INTRAVENOUS | Status: AC | PRN
  Administered 2016-10-29: 100 mL via INTRAVENOUS

## 2016-10-29 MED ORDER — MORPHINE SULFATE (PF) 4 MG/ML IV SOLN
4.0000 mg | Freq: Once | INTRAVENOUS | Status: AC
  Administered 2016-10-29: 4 mg via INTRAVENOUS
  Filled 2016-10-29: qty 1

## 2016-10-29 MED ORDER — SODIUM CHLORIDE 0.9 % IV BOLUS (SEPSIS)
1000.0000 mL | Freq: Once | INTRAVENOUS | Status: AC
  Administered 2016-10-29: 1000 mL via INTRAVENOUS

## 2016-10-29 MED ORDER — ASPIRIN 81 MG PO CHEW: 324.0000 mg | CHEWABLE_TABLET | Freq: Once | ORAL | Status: DC

## 2016-10-29 MED ORDER — SODIUM CHLORIDE 0.9% FLUSH
INTRAVENOUS | Status: AC
  Administered 2016-10-29: 07:00:00
  Filled 2016-10-29: qty 300

## 2016-10-29 MED ORDER — ZOLPIDEM TARTRATE 5 MG PO TABS: 10.0000 mg | ORAL_TABLET | Freq: Every evening | ORAL | Status: DC | PRN

## 2016-10-29 MED ORDER — HEPARIN SODIUM (PORCINE) 5000 UNIT/ML IJ SOLN
5000.0000 [IU] | Freq: Three times a day (TID) | INTRAMUSCULAR | Status: DC
Start: 2016-10-29 — End: 2016-10-30
  Filled 2016-10-29 (×2): qty 1

## 2016-10-29 MED ORDER — IBUPROFEN 600 MG PO TABS
600.0000 mg | ORAL_TABLET | Freq: Four times a day (QID) | ORAL | Status: DC | PRN
  Administered 2016-10-29: 600 mg via ORAL
  Filled 2016-10-29: qty 1

## 2016-10-29 MED ORDER — ACETAMINOPHEN 325 MG PO TABS: 650.0000 mg | ORAL_TABLET | Freq: Four times a day (QID) | ORAL | Status: DC | PRN

## 2016-10-29 MED ORDER — OXYCODONE HCL 5 MG PO TABS
5.0000 mg | ORAL_TABLET | Freq: Four times a day (QID) | ORAL | Status: DC | PRN
Start: 2016-10-29 — End: 2016-10-29
  Administered 2016-10-29: 5 mg via ORAL
  Filled 2016-10-29: qty 1

## 2016-10-29 MED ORDER — ALBUTEROL SULFATE (2.5 MG/3ML) 0.083% IN NEBU: 2.5000 mg | INHALATION_SOLUTION | RESPIRATORY_TRACT | Status: DC | PRN

## 2016-10-29 MED ORDER — HYDROMORPHONE HCL 1 MG/ML IJ SOLN
1.0000 mg | Freq: Once | INTRAMUSCULAR | Status: AC
  Administered 2016-10-29: 1 mg via INTRAVENOUS
  Filled 2016-10-29: qty 1

## 2016-10-29 MED ORDER — MORPHINE SULFATE (PF) 4 MG/ML IV SOLN
4.0000 mg | Freq: Once | INTRAVENOUS | Status: AC
  Administered 2016-10-29: 4 mg via INTRAVENOUS

## 2016-10-29 MED ORDER — ORAL CARE MOUTH RINSE
15.0000 mL | Freq: Two times a day (BID) | OROMUCOSAL | Status: DC
Start: 2016-10-29 — End: 2016-10-30
  Administered 2016-10-29: 15 mL via OROMUCOSAL

## 2016-10-29 MED ORDER — LEVOFLOXACIN 750 MG PO TABS: 750.0000 mg | ORAL_TABLET | Freq: Every day | ORAL | 0 refills | Status: AC

## 2016-10-29 MED ORDER — ONDANSETRON HCL 4 MG PO TABS: 4.0000 mg | ORAL_TABLET | Freq: Four times a day (QID) | ORAL | Status: DC | PRN

## 2016-10-29 MED ORDER — OXYCODONE HCL 5 MG PO TABS
10.0000 mg | ORAL_TABLET | Freq: Four times a day (QID) | ORAL | Status: DC | PRN
  Administered 2016-10-29 – 2016-10-30 (×3): 10 mg via ORAL
  Filled 2016-10-29 (×3): qty 2

## 2016-10-29 MED ORDER — PIPERACILLIN-TAZOBACTAM 3.375 G IVPB 30 MIN
3.3750 g | Freq: Once | INTRAVENOUS | Status: AC
  Administered 2016-10-29: 3.375 g via INTRAVENOUS
  Filled 2016-10-29: qty 50

## 2016-10-29 MED ORDER — SODIUM CHLORIDE 0.9% FLUSH
3.0000 mL | Freq: Two times a day (BID) | INTRAVENOUS | Status: DC
  Administered 2016-10-29: 3 mL via INTRAVENOUS

## 2016-10-29 MED ORDER — GUAIFENESIN ER 600 MG PO TB12
600.0000 mg | ORAL_TABLET | Freq: Two times a day (BID) | ORAL | Status: DC
  Administered 2016-10-29 (×2): 600 mg via ORAL
  Filled 2016-10-29 (×3): qty 1

## 2016-10-29 MED ORDER — SODIUM CHLORIDE 0.9 % IV SOLN: Freq: Once | INTRAVENOUS | Status: DC

## 2016-10-29 MED ORDER — SODIUM CHLORIDE 0.9 % IV SOLN
INTRAVENOUS | Status: DC
  Administered 2016-10-29: 07:00:00 via INTRAVENOUS

## 2016-10-29 MED ORDER — PIPERACILLIN-TAZOBACTAM 3.375 G IVPB
3.3750 g | Freq: Three times a day (TID) | INTRAVENOUS | Status: DC
  Administered 2016-10-29 – 2016-10-30 (×3): 3.375 g via INTRAVENOUS
  Filled 2016-10-29 (×3): qty 50

## 2016-10-29 MED ORDER — HYDRALAZINE HCL 20 MG/ML IJ SOLN: 10.0000 mg | Freq: Three times a day (TID) | INTRAMUSCULAR | Status: DC | PRN

## 2016-10-29 MED ORDER — ONDANSETRON HCL 4 MG/2ML IJ SOLN: 4.0000 mg | Freq: Four times a day (QID) | INTRAMUSCULAR | Status: DC | PRN

## 2016-10-29 MED ORDER — ENSURE ENLIVE PO LIQD: 237.0000 mL | Freq: Two times a day (BID) | ORAL | Status: DC

## 2016-10-29 MED ORDER — SODIUM CHLORIDE 0.9 % IV SOLN
Freq: Once | INTRAVENOUS | Status: AC
  Administered 2016-10-29: 05:00:00 via INTRAVENOUS

## 2016-10-29 MED ORDER — PANCRELIPASE (LIP-PROT-AMYL) 12000-38000 UNITS PO CPEP
24000.0000 [IU] | ORAL_CAPSULE | Freq: Three times a day (TID) | ORAL | Status: DC
  Administered 2016-10-30: 24000 [IU] via ORAL
  Filled 2016-10-29: qty 2

## 2016-10-29 MED ORDER — ACETAMINOPHEN 500 MG PO TABS
1000.0000 mg | ORAL_TABLET | Freq: Once | ORAL | Status: AC
  Administered 2016-10-29: 1000 mg via ORAL
  Filled 2016-10-29: qty 2

## 2016-10-29 MED ORDER — ACETAMINOPHEN 650 MG RE SUPP: 650.0000 mg | Freq: Four times a day (QID) | RECTAL | Status: DC | PRN

## 2016-10-29 NOTE — Progress Notes (Signed)
Reexamined pt at bedside. Still doing well. BP drop likely due to pain meds. Pt wants to give fluids more time. Tachycardia almost resolved. From 113-->103. Pt understands risks and benefits.  Elwin Mocha MD

## 2016-10-29 NOTE — ED Triage Notes (Addendum)
Pt reports chest pain that started one hour ago located at sternum and radiating to back. Pt reports nausea that is ongoing. Chest pain reproducible with palpation. Pt reports SOB as well. Pt recently had whipple procedure at [facility].

## 2016-10-29 NOTE — H&P (Signed)
[facility] History and Physical  Luis Frey ION:[id number] DOB: 1970/05/12 DOA: 10/29/2016  Referring physician:  PCP: Jalene Mullet, PA-C   Chief Complaint: chest pain  HPI: Luis Frey is a 46 y.o. male  with past medical history of pancreatic cancer status post Whipple, anxiety, reflux, kidney stones, biliary stent, SMA repair after bleed presents to the emergency room with complaint of chest discomfort. Patient states that he's been coughing for about the last week. Cough has been productive of white thick sputum. Denies any fevers. Patient did have also chest pain this evening for multiple bouts of lasted 45 minutes. Described as a pressure. Midsternal. Not worse with cough or deep breath. Patient did experience a sensation of freezing during one of these episodes. Also some nausea. The pain also radiated to the back. This alarmed the patient and he came to emergency room for evaluation.  ED course: CTAof chest ordered, incomplete due to infiltration. Patient given 3 L bolus with response of low blood pressure normotensive. Then after pain medicine blood pressure dropped again patient given another bolus responded. CT of the chest did reveal debris in the right bronchi. Patient initially found to be febrile with elevated lactic acid. Lactic acid normalized after fluids. Hospitalist consulted for admission.  Review of Systems:  As per HPI otherwise 10 point review of systems negative.    Past Medical History:  Diagnosis Date  . Ampullary carcinoma (HCC)    pancreatic   . Anxiety   . GERD (gastroesophageal reflux disease)   . History of kidney stones    Past Surgical History:  Procedure Laterality Date  . BILIARY STENT PLACEMENT N/A 07/06/2016   Procedure: BILIARY STENT PLACEMENT;  Surgeon: Daneil Dolin, MD;  [facility];  Service: Endoscopy;  Laterality: N/A;  . BIOPSY  07/06/2016   Procedure: BIOPSY ampullary;  Surgeon: Daneil Dolin, MD;  Location[facility];  Service: Endoscopy;;  . ERCP N/A 07/06/2016   Procedure: ENDOSCOPIC RETROGRADE CHOLANGIOPANCREATOGRAPHY (ERCP);  Surgeon: Daneil Dolin, MD;  [facility];  Service: Endoscopy;  Laterality: N/A;  130   . none    . SPHINCTEROTOMY  07/06/2016   Procedure: SPHINCTEROTOMY;  Surgeon: Daneil Dolin, MD;  [facility];  Service: Endoscopy;;  . WHIPPLE PROCEDURE  08/08/2016   Dr. Crisoforo Oxford at [facility] History:  reports that he quit smoking about 5 years ago. His smoking use included Cigarettes. He has a 24.00 pack-year smoking history. He quit smokeless tobacco use about 34 years ago. His smokeless tobacco use included Snuff. He reports that he uses drugs, including Marijuana. He reports that he does not drink alcohol.  Allergies  Allergen Reactions  . Poison Oak Extract     Family History  Problem Relation Age of Onset  . Liver disease Neg Hx      Prior to Admission medications   Medication Sig Start Date End Date Taking? Authorizing Provider  enoxaparin (LOVENOX) 40 MG/0.4ML injection Inject 40 mg into the skin daily. 08/18/16 09/01/16  [provider]  ondansetron (ZOFRAN) 4 MG tablet Take 4 mg by mouth every 8 (eight) hours as needed for nausea or vomiting.     [provider]  zolpidem (AMBIEN) 10 MG tablet Take 1 tablet (10 mg total) by mouth at bedtime as needed for sleep. Take at least 7 hours before planned awakening. 07/05/16 08/19/16  Mahala Menghini, PA-C   Physical Exam: Vitals:  10/29/16 0530 10/29/16 0531 10/29/16 0542 10/29/16 0545  BP: (!) 80/50 (!) 81/50 (!) 85/55 (!) 90/53  Pulse: (!) 110 (!) 109  (!) 112  Resp: 19 16  15   Temp:      TempSrc:      SpO2: 100% 100%  100%  Weight:      Height:        Wt Readings from Last 3 Encounters:  10/29/16 94.8 kg (209 lb)  07/06/16 117.9 kg (260 lb)  07/05/16 117.9 kg (260 lb)    General:  Appears calm and comfortable; A&Ox3, ill appearing Eyes:  PERRL, EOMI,  normal lids, iris ENT:  grossly normal hearing, lips & tongue Neck:  no LAD, masses or thyromegaly Cardiovascular:  Tachycardia, RR, no m/r/g. No LE edema. 1+ DP/PT/radial Respiratory:  CTA bilaterally, no w/r/r. Normal respiratory effort. Abdomen:  soft, ntnd; 3 drains in abdomen Skin:  no rash or induration seen on limited exam Musculoskeletal:  grossly normal tone BUE/BLE Psychiatric:  grossly normal mood and affect, speech fluent and appropriate Neurologic:  CN 2-12 grossly intact, moves all extremities in coordinated fashion.          Labs on Admission:  Basic Metabolic Panel:  Recent Labs Lab 10/29/16 0105  NA 137  K 3.6  CL 99*  CO2 26  GLUCOSE 92  BUN 14  CREATININE 0.83  CALCIUM 9.3   Liver Function Tests:  Recent Labs Lab 10/29/16 0105  AST 44*  ALT 16*  ALKPHOS 189*  BILITOT 0.5  PROT 7.6  ALBUMIN 3.3*    Recent Labs Lab 10/29/16 0105  LIPASE 32   No results for input(s): AMMONIA in the last 168 hours. CBC:  Recent Labs Lab 10/29/16 0105  WBC 5.9  NEUTROABS 5.7  HGB 11.1*  HCT 37.5*  MCV 80.3  PLT 491*   Cardiac Enzymes: No results for input(s): CKTOTAL, CKMB, CKMBINDEX, TROPONINI in the last 168 hours.  BNP (last 3 results) No results for input(s): BNP in the last 8760 hours.  ProBNP (last 3 results) No results for input(s): PROBNP in the last 8760 hours.   Serum creatinine: 0.83 mg/dL 10/29/16 0105 Estimated creatinine clearance: 129.3 mL/min  CBG: No results for input(s): GLUCAP in the last 168 hours.  Radiological Exams on Admission: Ct Chest W Contrast  Result Date: 10/29/2016 CLINICAL DATA:  Chest pain with palpation, shortness of breath. History of pancreatic cancer and Whipple procedure June 2018, kidney stones. EXAM: CT CHEST, ABDOMEN, AND PELVIS WITH CONTRAST TECHNIQUE: Multidetector CT imaging of the chest, abdomen and pelvis was performed following the standard protocol during bolus administration of intravenous  contrast. CONTRAST:  116mL ISOVUE-300 IOPAMIDOL (ISOVUE-300) INJECTION 61% COMPARISON:  CT abdomen and pelvis Jul 04, 2016 FINDINGS: CT CHEST FINDINGS CARDIOVASCULAR: Heart size is normal. No pericardial effusions. Thoracic aorta is normal course and caliber, trace calcific atherosclerosis. No central pulmonary embolism though not tailored for evaluation. MEDIASTINUM/NODES: No mediastinal mass. No lymphadenopathy by CT size criteria. Normal appearance of thoracic esophagus though not tailored for evaluation. LUNGS/PLEURA: Nonobstructing dependent debris within RIGHT mainstem bronchus. Mild bronchial wall thickening. No pneumothorax. Bibasilar discoid atelectasis. No pleural effusion or focal consolidation. MUSCULOSKELETAL: Included soft tissues and included osseous structures appear normal. CT ABDOMEN AND PELVIS FINDINGS- streak artifact recommend as patient's arm for scanned at side of body. HEPATOBILIARY: Resolved intrahepatic biliary dilatation status post cholecystectomy. PANCREAS: Pancreatic head and proximal body resection. SPLEEN: Normal. ADRENALS/URINARY TRACT: Kidneys are orthotopic, demonstrating symmetric enhancement. 2 mm RIGHT lower pole  and 3 mm LEFT upper pole nephrolithiasis, additional punctate nephrolithiasis on the LEFT. No hydronephrosis or solid renal masses. The unopacified ureters are normal in course and caliber. Delayed imaging through the kidneys demonstrates symmetric prompt contrast excretion within the proximal urinary collecting system. Urinary bladder is partially distended and unremarkable. Normal adrenal glands. STOMACH/BOWEL: Partial gastrectomy with expected postsurgical changes of the stomach. The small and large bowel are normal in course and caliber without inflammatory changes though sensitivity decreased without oral contrast. Normal appendix. VASCULAR/LYMPHATIC: Aortoiliac vessels are normal in course and caliber. Proximal SMA short segment stent. Mild calcific  atherosclerosis. No lymphadenopathy by CT size criteria. REPRODUCTIVE: Normal. OTHER: 2 perihepatic Jackson-Pratt drains in place. Small volume ascites without focal fluid collection. Mild mesenteric edema consistent with recent surgery. MUSCULOSKELETAL: Non-acute.  Anterior abdominal wall scarring. IMPRESSION: CT CHEST: 1. Debris within RIGHT mainstem bronchus consistent with aspiration. 2. Mild bronchial wall thickening seen with bronchitis or reactive airway disease. Bite basilar atelectasis. CT ABDOMEN AND PELVIS: 1. Status post recent Whipple procedure with Jackson-Pratt drains in place. Small volume ascites without drainable fluid collection. No residual biliary dilatation. 2. Bilateral small nonobstructing nephrolithiasis. Aortic Atherosclerosis (ICD10-I70.0). Electronically Signed   By: Elon Alas M.D.   On: 10/29/2016 04:49   Ct Abdomen Pelvis W Contrast  Result Date: 10/29/2016 CLINICAL DATA:  Chest pain with palpation, shortness of breath. History of pancreatic cancer and Whipple procedure June 2018, kidney stones. EXAM: CT CHEST, ABDOMEN, AND PELVIS WITH CONTRAST TECHNIQUE: Multidetector CT imaging of the chest, abdomen and pelvis was performed following the standard protocol during bolus administration of intravenous contrast. CONTRAST:  176mL ISOVUE-300 IOPAMIDOL (ISOVUE-300) INJECTION 61% COMPARISON:  CT abdomen and pelvis Jul 04, 2016 FINDINGS: CT CHEST FINDINGS CARDIOVASCULAR: Heart size is normal. No pericardial effusions. Thoracic aorta is normal course and caliber, trace calcific atherosclerosis. No central pulmonary embolism though not tailored for evaluation. MEDIASTINUM/NODES: No mediastinal mass. No lymphadenopathy by CT size criteria. Normal appearance of thoracic esophagus though not tailored for evaluation. LUNGS/PLEURA: Nonobstructing dependent debris within RIGHT mainstem bronchus. Mild bronchial wall thickening. No pneumothorax. Bibasilar discoid atelectasis. No pleural  effusion or focal consolidation. MUSCULOSKELETAL: Included soft tissues and included osseous structures appear normal. CT ABDOMEN AND PELVIS FINDINGS- streak artifact recommend as patient's arm for scanned at side of body. HEPATOBILIARY: Resolved intrahepatic biliary dilatation status post cholecystectomy. PANCREAS: Pancreatic head and proximal body resection. SPLEEN: Normal. ADRENALS/URINARY TRACT: Kidneys are orthotopic, demonstrating symmetric enhancement. 2 mm RIGHT lower pole and 3 mm LEFT upper pole nephrolithiasis, additional punctate nephrolithiasis on the LEFT. No hydronephrosis or solid renal masses. The unopacified ureters are normal in course and caliber. Delayed imaging through the kidneys demonstrates symmetric prompt contrast excretion within the proximal urinary collecting system. Urinary bladder is partially distended and unremarkable. Normal adrenal glands. STOMACH/BOWEL: Partial gastrectomy with expected postsurgical changes of the stomach. The small and large bowel are normal in course and caliber without inflammatory changes though sensitivity decreased without oral contrast. Normal appendix. VASCULAR/LYMPHATIC: Aortoiliac vessels are normal in course and caliber. Proximal SMA short segment stent. Mild calcific atherosclerosis. No lymphadenopathy by CT size criteria. REPRODUCTIVE: Normal. OTHER: 2 perihepatic Jackson-Pratt drains in place. Small volume ascites without focal fluid collection. Mild mesenteric edema consistent with recent surgery. MUSCULOSKELETAL: Non-acute.  Anterior abdominal wall scarring. IMPRESSION: CT CHEST: 1. Debris within RIGHT mainstem bronchus consistent with aspiration. 2. Mild bronchial wall thickening seen with bronchitis or reactive airway disease. Bite basilar atelectasis. CT ABDOMEN AND PELVIS: 1. Status post recent  Whipple procedure with Jackson-Pratt drains in place. Small volume ascites without drainable fluid collection. No residual biliary dilatation. 2.  Bilateral small nonobstructing nephrolithiasis. Aortic Atherosclerosis (ICD10-I70.0). Electronically Signed   By: Elon Alas M.D.   On: 10/29/2016 04:49   Dg Chest Port 1 View  Result Date: 10/29/2016 CLINICAL DATA:  Sternal chest pain radiating to the back starting 1 hour ago. Nausea. Pain on palpation. Shortness of breath. Recent Whipple procedure. Code sepsis. EXAM: PORTABLE CHEST 1 VIEW COMPARISON:  None. FINDINGS: Shallow inspiration with linear atelectasis in both lung bases and in the left mid lung. Heart size and pulmonary vascularity are normal. No airspace disease or consolidation in the lungs. No blunting of costophrenic angles. No pneumothorax. Pigtail catheter in the right upper quadrant abdomen. IMPRESSION: Shallow inspiration with linear atelectasis in the lung bases. Electronically Signed   By: Lucienne Capers M.D.   On: 10/29/2016 01:44    EKG: Independently reviewed. Sinus tach. No stemi.  Assessment/Plan Principal Problem:   Aspiration into airway Active Problems:   Sepsis (HCC)   Sepsis 2/2 aspiration pna Patient hemodynamically stable Given zosyn emergency room, will continue Blood cultures 2 pending Patient given 4000 mL of fluid in the emergency room Lactic acid normal, initally was elevated Lipase normal UA pending Mucinex bid Albuterolprn RT consult CTA chest for PE by EDP not completed no VQ scan for now as aspiration or infxn likely based on hx  Anxiety No SI/HI Ambien for sleep  GERD Controlled Will monitor  Infiltration of contrast Elevate and cold packs  Code Status: FULL  DVT Prophylaxis: Heparin Family Communication: wife at bedside Disposition Plan: Pending Improvement  Status: sdu inpt  Elwin Mocha, MD Family Medicine [facility] [email or web address] Password [email or web address]

## 2016-10-29 NOTE — ED Notes (Signed)
Pt given water per Dr Dina Rich okay

## 2016-10-29 NOTE — Progress Notes (Signed)
Pharmacy Antibiotic Note  Luis Frey is a 46 y.o. male admitted on 10/29/2016 with pneumonia.  Pharmacy has been consulted for ZOSYN dosing.  Plan: Zosyn 3.375gm IV q8h, (4 HR INFUSION) Monitor labs, progress, c/s  Height: 5\' 11"  (180.3 cm) Weight: 208 lb 12.4 oz (94.7 kg) IBW/kg (Calculated) : 75.3  Temp (24hrs), Avg:99.2 F (37.3 C), Min:98.2 F (36.8 C), Max:101 F (38.3 C)   Recent Labs Lab 10/29/16 0105 10/29/16 0117 10/29/16 0525  WBC 5.9  --   --   CREATININE 0.83  --   --   LATICACIDVEN  --  3.10* 1.53    Estimated Creatinine Clearance: 129.3 mL/min (by C-G formula based on SCr of 0.83 mg/dL).    Allergies  Allergen Reactions  . Poison Oak Extract    Antimicrobials this admission: Zosyn 9/17 >>   Microbiology results: 9/17 BCx: pending  Thank you for allowing pharmacy to be a part of this patient's care.  Hart Robinsons A 10/29/2016 12:17 PM

## 2016-10-29 NOTE — ED Notes (Signed)
Pt remains in CT at this time. CT called to inform that contrast is not showing up in the vein that IV was just started in (right upper arm) and unable to perform CT angiogram. Dr Dina Rich made aware. Verbal orders received to change scan to CT chest, Abd, Pelvis with contrast.

## 2016-10-29 NOTE — ED Notes (Signed)
Gwyndolyn Saxon called from CT and informed that ultrasound guided IV to right upper forearm was not flushing. Dr Dina Rich to see pt in  [place].

## 2016-10-29 NOTE — ED Notes (Signed)
Ice pack applied to left arm D/T infiltration of IV contrast. Arm elevated on pillow. Left upper arm has mild edema, no discoloration.

## 2016-10-29 NOTE — ED Provider Notes (Addendum)
[name] DEPT Provider Note   CSN: [id number] Arrival date & time: 10/29/16  0037     History   Chief Complaint Chief Complaint  Patient presents with  . Chest Pain    HPI Luis Frey is a 46 y.o. male.  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 Postoperatively he has had complications including a pancreatic leak and multiple infections. He also had an SMA hemorrhage. He was just recently admitted to [facility] for worsening intra-abdominal infections. At that time his drains were repositioned. Patient presents today with one hour history of worsening pressure-like chest pain. Somewhat worse with breathing. Denies shortness of breath. Is not on home oxygen but was noted to be 89% on room air for EMS. He denies any recent fevers or cough. He does report baseline abdominal pain. No nausea or vomiting.  Past Medical History:  Diagnosis Date  . Ampullary carcinoma (HCC)    pancreatic   . Anxiety   . GERD (gastroesophageal reflux disease)   . History of kidney stones     Patient Active Problem List   Diagnosis Date Noted  . Aspiration into airway 10/29/2016  . Dilated bile duct 07/05/2016  . Biliary obstruction 07/05/2016  . Abnormal LFTs 03/21/2016  . Anemia 03/21/2016  . Melena 03/21/2016    Past Surgical History:  Procedure Laterality Date  . BILIARY STENT PLACEMENT N/A 07/06/2016   Procedure: BILIARY STENT PLACEMENT;  Surgeon: Daneil Dolin, MD;  Location: [facility] ENDO SUITE;  Service: Endoscopy;  Laterality: N/A;  . BIOPSY  07/06/2016   Procedure: BIOPSY ampullary;  Surgeon: Daneil Dolin, MD;  Location: [facility] ENDO SUITE;  Service: Endoscopy;;  . ERCP N/A 07/06/2016   Procedure: ENDOSCOPIC RETROGRADE CHOLANGIOPANCREATOGRAPHY (ERCP);  Surgeon: Daneil Dolin, MD;  Location: [facility] ENDO SUITE;  Service: Endoscopy;  Laterality: N/A;  130   . none    . SPHINCTEROTOMY  07/06/2016    Procedure: SPHINCTEROTOMY;  Surgeon: Daneil Dolin, MD;  Location: [facility] ENDO SUITE;  Service: Endoscopy;;  . WHIPPLE PROCEDURE  08/08/2016   Dr. Crisoforo Oxford at [facility] Medications    Prior to Admission medications   Medication Sig Start Date End Date Taking? Authorizing Provider  enoxaparin (LOVENOX) 40 MG/0.4ML injection Inject 40 mg into the skin daily. 08/18/16 09/01/16  [provider]  ondansetron (ZOFRAN) 4 MG tablet Take 4 mg by mouth every 8 (eight) hours as needed for nausea or vomiting.     [provider]  zolpidem (AMBIEN) 10 MG tablet Take 1 tablet (10 mg total) by mouth at bedtime as needed for sleep. Take at least 7 hours before planned awakening. 07/05/16 08/19/16  Mahala Menghini, PA-C    Family History Family History  Problem Relation Age of Onset  . Liver disease Neg Hx     Social History Social History  Substance Use Topics  . Smoking status: Former Smoker    Packs/day: 1.00    Years: 24.00    Types: Cigarettes    Quit date: 07/06/2011  . Smokeless tobacco: Former Systems developer    Types: Snuff    Quit date: 07/06/1982  . Alcohol use No     Comment: socially , moonshine once every 3 months     Allergies   Poison oak extract   Review of Systems Review of Systems  Constitutional: Negative for fever.  Respiratory: Negative  for cough and shortness of breath.   Cardiovascular: Positive for chest pain.  Gastrointestinal: Positive for abdominal pain. Negative for nausea and vomiting.  Musculoskeletal: Negative for back pain.  Skin: Negative for wound.  All other systems reviewed and are negative.    Physical Exam Updated Vital Signs BP (!) 85/55 (BP Location: Right Arm)   Pulse (!) 109   Temp (!) 101 F (38.3 C) (Rectal)   Resp 16   Ht 5\' 11"  (1.803 m)   Wt 94.8 kg (209 lb)   SpO2 100%   BMI 29.15 kg/m   Physical Exam  Constitutional: He is oriented to person, place, and time.  Ill-appearing  HENT:  Head:  Normocephalic and atraumatic.  Mucous membranes dry  Eyes: Pupils are equal, round, and reactive to light.  Cardiovascular: Regular rhythm and normal heart sounds.   No murmur heard. tachycardia  Pulmonary/Chest: Effort normal. No respiratory distress. He has no wheezes.  Nasal cannula in place, no respiratory distress, no wheezing, diminished breath sounds in all lung fields  Abdominal: Soft. Bowel sounds are normal. He exhibits no distension. There is tenderness. There is guarding. There is no rebound.  Abdomen soft with diffuse tenderness to palpation, voluntary guarding, extensive abdominal scarring noted, drains 3 including 2 JP drains and one according drain noted to be draining serosanguineous fluid  Musculoskeletal: He exhibits no edema.  Neurological: He is alert and oriented to person, place, and time.  Skin: Skin is warm and dry.  Psychiatric: He has a normal mood and affect.  Nursing note and vitals reviewed.    ED Treatments / Results  Labs (all labs ordered are listed, but only abnormal results are displayed) Labs Reviewed  COMPREHENSIVE METABOLIC PANEL - Abnormal; Notable for the following:       Result Value   Chloride 99 (*)    Albumin 3.3 (*)    AST 44 (*)    ALT 16 (*)    Alkaline Phosphatase 189 (*)    All other components within normal limits  CBC WITH DIFFERENTIAL/PLATELET - Abnormal; Notable for the following:    Hemoglobin 11.1 (*)    HCT 37.5 (*)    MCH 23.8 (*)    MCHC 29.6 (*)    RDW 18.0 (*)    Platelets 491 (*)    Lymphs Abs 0.3 (*)    Monocytes Absolute 0.0 (*)    All other components within normal limits  I-STAT CG4 LACTIC ACID, ED - Abnormal; Notable for the following:    Lactic Acid, Venous 3.10 (*)    All other components within normal limits  CULTURE, BLOOD (ROUTINE X 2)  CULTURE, BLOOD (ROUTINE X 2)  LIPASE, BLOOD  URINALYSIS, ROUTINE W REFLEX MICROSCOPIC  LACTIC ACID, PLASMA  LACTIC ACID, PLASMA  TROPONIN I  TROPONIN I  I-STAT  TROPONIN, ED  I-STAT CG4 LACTIC ACID, ED  I-STAT TROPONIN, ED  I-STAT CG4 LACTIC ACID, ED    EKG  EKG Interpretation  Date/Time:  Monday October 29 2016 00:42:34 EDT Ventricular Rate:  127 PR Interval:    QRS Duration: 100 QT Interval:  318 QTC Calculation: 463 R Axis:   77 Text Interpretation:  Sinus tachycardia Low voltage, precordial leads Borderline T abnormalities, inferior leads Baseline wander in lead(s) I II aVR Confirmed by Thayer Jew [phone]) on 10/29/2016 1:06:36 AM       Radiology Ct Chest W Contrast  Result Date: 10/29/2016 CLINICAL DATA:  Chest pain with palpation, shortness of breath. History of  pancreatic cancer and Whipple procedure June 2018, kidney stones. EXAM: CT CHEST, ABDOMEN, AND PELVIS WITH CONTRAST TECHNIQUE: Multidetector CT imaging of the chest, abdomen and pelvis was performed following the standard protocol during bolus administration of intravenous contrast. CONTRAST:  11mL ISOVUE-300 IOPAMIDOL (ISOVUE-300) INJECTION 61% COMPARISON:  CT abdomen and pelvis Jul 04, 2016 FINDINGS: CT CHEST FINDINGS CARDIOVASCULAR: Heart size is normal. No pericardial effusions. Thoracic aorta is normal course and caliber, trace calcific atherosclerosis. No central pulmonary embolism though not tailored for evaluation. MEDIASTINUM/NODES: No mediastinal mass. No lymphadenopathy by CT size criteria. Normal appearance of thoracic esophagus though not tailored for evaluation. LUNGS/PLEURA: Nonobstructing dependent debris within RIGHT mainstem bronchus. Mild bronchial wall thickening. No pneumothorax. Bibasilar discoid atelectasis. No pleural effusion or focal consolidation. MUSCULOSKELETAL: Included soft tissues and included osseous structures appear normal. CT ABDOMEN AND PELVIS FINDINGS- streak artifact recommend as patient's arm for scanned at side of body. HEPATOBILIARY: Resolved intrahepatic biliary dilatation status post cholecystectomy. PANCREAS: Pancreatic head and  proximal body resection. SPLEEN: Normal. ADRENALS/URINARY TRACT: Kidneys are orthotopic, demonstrating symmetric enhancement. 2 mm RIGHT lower pole and 3 mm LEFT upper pole nephrolithiasis, additional punctate nephrolithiasis on the LEFT. No hydronephrosis or solid renal masses. The unopacified ureters are normal in course and caliber. Delayed imaging through the kidneys demonstrates symmetric prompt contrast excretion within the proximal urinary collecting system. Urinary bladder is partially distended and unremarkable. Normal adrenal glands. STOMACH/BOWEL: Partial gastrectomy with expected postsurgical changes of the stomach. The small and large bowel are normal in course and caliber without inflammatory changes though sensitivity decreased without oral contrast. Normal appendix. VASCULAR/LYMPHATIC: Aortoiliac vessels are normal in course and caliber. Proximal SMA short segment stent. Mild calcific atherosclerosis. No lymphadenopathy by CT size criteria. REPRODUCTIVE: Normal. OTHER: 2 perihepatic Jackson-Pratt drains in place. Small volume ascites without focal fluid collection. Mild mesenteric edema consistent with recent surgery. MUSCULOSKELETAL: Non-acute.  Anterior abdominal wall scarring. IMPRESSION: CT CHEST: 1. Debris within RIGHT mainstem bronchus consistent with aspiration. 2. Mild bronchial wall thickening seen with bronchitis or reactive airway disease. Bite basilar atelectasis. CT ABDOMEN AND PELVIS: 1. Status post recent Whipple procedure with Jackson-Pratt drains in place. Small volume ascites without drainable fluid collection. No residual biliary dilatation. 2. Bilateral small nonobstructing nephrolithiasis. Aortic Atherosclerosis (ICD10-I70.0). Electronically Signed   By: Elon Alas M.D.   On: 10/29/2016 04:49   Ct Abdomen Pelvis W Contrast  Result Date: 10/29/2016 CLINICAL DATA:  Chest pain with palpation, shortness of breath. History of pancreatic cancer and Whipple procedure June  2018, kidney stones. EXAM: CT CHEST, ABDOMEN, AND PELVIS WITH CONTRAST TECHNIQUE: Multidetector CT imaging of the chest, abdomen and pelvis was performed following the standard protocol during bolus administration of intravenous contrast. CONTRAST:  179mL ISOVUE-300 IOPAMIDOL (ISOVUE-300) INJECTION 61% COMPARISON:  CT abdomen and pelvis Jul 04, 2016 FINDINGS: CT CHEST FINDINGS CARDIOVASCULAR: Heart size is normal. No pericardial effusions. Thoracic aorta is normal course and caliber, trace calcific atherosclerosis. No central pulmonary embolism though not tailored for evaluation. MEDIASTINUM/NODES: No mediastinal mass. No lymphadenopathy by CT size criteria. Normal appearance of thoracic esophagus though not tailored for evaluation. LUNGS/PLEURA: Nonobstructing dependent debris within RIGHT mainstem bronchus. Mild bronchial wall thickening. No pneumothorax. Bibasilar discoid atelectasis. No pleural effusion or focal consolidation. MUSCULOSKELETAL: Included soft tissues and included osseous structures appear normal. CT ABDOMEN AND PELVIS FINDINGS- streak artifact recommend as patient's arm for scanned at side of body. HEPATOBILIARY: Resolved intrahepatic biliary dilatation status post cholecystectomy. PANCREAS: Pancreatic head and proximal body resection. SPLEEN: Normal.  ADRENALS/URINARY TRACT: Kidneys are orthotopic, demonstrating symmetric enhancement. 2 mm RIGHT lower pole and 3 mm LEFT upper pole nephrolithiasis, additional punctate nephrolithiasis on the LEFT. No hydronephrosis or solid renal masses. The unopacified ureters are normal in course and caliber. Delayed imaging through the kidneys demonstrates symmetric prompt contrast excretion within the proximal urinary collecting system. Urinary bladder is partially distended and unremarkable. Normal adrenal glands. STOMACH/BOWEL: Partial gastrectomy with expected postsurgical changes of the stomach. The small and large bowel are normal in course and caliber  without inflammatory changes though sensitivity decreased without oral contrast. Normal appendix. VASCULAR/LYMPHATIC: Aortoiliac vessels are normal in course and caliber. Proximal SMA short segment stent. Mild calcific atherosclerosis. No lymphadenopathy by CT size criteria. REPRODUCTIVE: Normal. OTHER: 2 perihepatic Jackson-Pratt drains in place. Small volume ascites without focal fluid collection. Mild mesenteric edema consistent with recent surgery. MUSCULOSKELETAL: Non-acute.  Anterior abdominal wall scarring. IMPRESSION: CT CHEST: 1. Debris within RIGHT mainstem bronchus consistent with aspiration. 2. Mild bronchial wall thickening seen with bronchitis or reactive airway disease. Bite basilar atelectasis. CT ABDOMEN AND PELVIS: 1. Status post recent Whipple procedure with Jackson-Pratt drains in place. Small volume ascites without drainable fluid collection. No residual biliary dilatation. 2. Bilateral small nonobstructing nephrolithiasis. Aortic Atherosclerosis (ICD10-I70.0). Electronically Signed   By: Elon Alas M.D.   On: 10/29/2016 04:49   Dg Chest Port 1 View  Result Date: 10/29/2016 CLINICAL DATA:  Sternal chest pain radiating to the back starting 1 hour ago. Nausea. Pain on palpation. Shortness of breath. Recent Whipple procedure. Code sepsis. EXAM: PORTABLE CHEST 1 VIEW COMPARISON:  None. FINDINGS: Shallow inspiration with linear atelectasis in both lung bases and in the left mid lung. Heart size and pulmonary vascularity are normal. No airspace disease or consolidation in the lungs. No blunting of costophrenic angles. No pneumothorax. Pigtail catheter in the right upper quadrant abdomen. IMPRESSION: Shallow inspiration with linear atelectasis in the lung bases. Electronically Signed   By: Lucienne Capers M.D.   On: 10/29/2016 01:44    Procedures Procedures (including critical care time)  CRITICAL CARE Performed by: Merryl Hacker   Total critical care time: 35  minutes  Critical care time was exclusive of separately billable procedures and treating other patients.  Critical care was necessary to treat or prevent imminent or life-threatening deterioration.  Critical care was time spent personally by me on the following activities: development of treatment plan with patient and/or surrogate as well as nursing, discussions with consultants, evaluation of patient's response to treatment, examination of patient, obtaining history from patient or surrogate, ordering and performing treatments and interventions, ordering and review of laboratory studies, ordering and review of radiographic studies, pulse oximetry and re-evaluation of patient's condition.   Angiocath insertion Performed by: Merryl Hacker  Consent: Verbal consent obtained. Risks and benefits: risks, benefits and alternatives were discussed Time out: Immediately prior to procedure a "time out" was called to verify the correct patient, procedure, equipment, support staff and site/side marked as required.  Preparation: Patient was prepped and draped in the usual sterile fashion.  Vein Location: left basilic  Ultrasound Guided  Gauge: 20  Normal blood return and flush without difficulty Patient tolerance: Patient tolerated the procedure well with no immediate complications.     Medications Ordered in ED Medications  sodium chloride flush (NS) 0.9 % injection (not administered)  sodium chloride 0.9 % bolus 1,000 mL (0 mLs Intravenous Stopped 10/29/16 0211)    And  sodium chloride 0.9 % bolus 1,000 mL (0  mLs Intravenous Stopped 10/29/16 0240)    And  sodium chloride 0.9 % bolus 1,000 mL (0 mLs Intravenous Stopped 10/29/16 0300)  piperacillin-tazobactam (ZOSYN) IVPB 3.375 g (0 g Intravenous Stopped 10/29/16 0211)  morphine 4 MG/ML injection 4 mg (4 mg Intravenous Given 10/29/16 0142)  morphine 4 MG/ML injection 4 mg (4 mg Intravenous Given 10/29/16 0215)  iopamidol (ISOVUE-300) 61 %  injection 100 mL (100 mLs Intravenous Contrast Given 10/29/16 0326)  HYDROmorphone (DILAUDID) injection 1 mg (1 mg Intravenous Given 10/29/16 0355)  acetaminophen (TYLENOL) tablet 1,000 mg (1,000 mg Oral Given 10/29/16 0511)  0.9 %  sodium chloride infusion ( Intravenous Rate/Dose Change 10/29/16 0543)     Initial Impression / Assessment and Plan / ED Course  I have reviewed the triage vital signs and the nursing notes.  Pertinent labs & imaging results that were available during my care of the patient were reviewed by me and considered in my medical decision making (see chart for details).     Patient with a very complicated medical history presents with chest pain. Recent history of multiple intra-abdominal infections and SMA bleed. This was following a Whipple procedure.  EKG shows sinus tachycardia without evidence of ischemia. Initial troponin is negative. Patient was tachycardic and hypotensive upon arrival. For this reason, septic workup was initiated. Rectal temperature was 101. Patient responded well to 30 mL/kg fluid.  Patient was given Zosyn to cover for intra-abdominal pathology. Initial lactate is 3.1. CT scan PE study plus abdomen was ordered. Unfortunately, patient is a difficult stick. Did not have a suitable IV. I placed a 20-gauge IV in the left arm some: However, it infiltrated upon injection of contrast. CT chest transition to normal chest with contrast. I have reviewed these images. Question aspiration.  Abdominal CT is stable and reassuring at this time. I discussed the result with the patient is wife. PE is still on the differential given history of cancer and persistent tachycardia although improved.  Patient continues to have some chest pain. Recommend admission for VQ scan and further management. At this time I do not feel strongly that he needs specialty care [facility]; however, I discussed with the patient his preferences regarding care. He would like to stay at [facility] if at all  possible as is more convenient to his family.  5:25 AM Discussed with admitting hospitalist. He will evaluate for admission here.  5:45 Advised by nursing patient has had several persistent blood pressure readings in the 80s. Ordered 1 L of fluids with continuous fluid. Patient previously fluid responsive. He continues to [facility] without difficulty. Lactate cleared. Troponin negative. Will reassess with hospitalist after additional fluid bolus.  Sepsis - Repeat Assessment  Performed at:    5:50  Vitals     Blood pressure (!) 85/55, pulse (!) 109, temperature (!) 101 F (38.3 C), temperature source Rectal, resp. rate 16, height 5\' 11"  (1.803 m), weight 94.8 kg (209 lb), SpO2 100 %.  Heart:     Regular rate and rhythm and Tachycardic  Lungs:    Rales  Capillary Refill:   <2 sec  Peripheral Pulse:   Radial pulse palpable  Skin:     Pale    Final Clinical Impressions(s) / ED Diagnoses   Final diagnoses:  Atypical chest pain  Fever in adult  Sepsis, due to unspecified organism [facility])    New Prescriptions New Prescriptions   No medications on file     Merryl Hacker, MD 10/29/16 [phone]  Merryl Hacker, MD 10/29/16 6015    Merryl Hacker, MD 10/29/16 [phone]

## 2016-10-29 NOTE — Progress Notes (Signed)
   Follow Up Note  HPI: 46 year old male past mental history of pancreatic cancer status post Whipple procedure along with SMA repair following bleed who presented to the emergency room on 9/17 for one week of coughing, weakness and chills. Patient found to be in sepsis with lactic acidosis, hypotension and pulmonary source seen on CT as right mainstem bronchus debris consistent with aspiration. Patient started on aggressive fluid resuscitation and IV antibiotics.  This morning, he refused most lab work secondary to difficulty getting blood. Complains of some chronic pain (his pain medications were held secondary to hypotension).  Breathing he says is okay  Exam: CV: Regular rate and rhythm, S1 and S2 Lungs: Clear to auscultation bilaterally Abd: Soft, nontender, nondistended, positive bowel sounds Ext: No clubbing or cyanosis or edema  Present on Admission: . Aspiration into airway . Sepsis ([place]) secondary to aspiration pneumonia: In discussion with patient and his wife, this is actually patient's third pneumonia in the past 6 weeks. In review of the patient's chart, he is on OxyIR 10 mg every 3 hours plus nightly Xanax. She had my concern that likely patient is becoming too sedated from his medications leading to aspiration. They understood the severity of the sepsis. Patient met criteria for sepsis on admission given lactic acidosis, hypotension and pulmonary source. Stabilized. Lactic acid level this morning under 2. Continue IV fluids and IV antibiotics. We'll plan to discharge tomorrow on by mouth Levaquin if lab work stable. Patient not requiring additional oxygen . Abnormal LFTs . Ampullary carcinoma ([place]): Status post Whipple . Chronic pain: Wife tells me that physicians are working to scale back his narcotics.  THC use   Disposition: Potential discharge tomorrow if sepsis stable. Follow-up labs in the morning.

## 2016-10-29 NOTE — ED Notes (Signed)
Patient transported to CT 

## 2016-10-29 NOTE — Care Management Note (Signed)
Case Management Note  Patient Details  Name: Luis Frey MRN: [id number] Date of Birth: 1970/10/09  Subjective/Objective:                  Pt admitted with aspiration. Pt is from home with wife. He recently underwent Wipple Procedure, has JP drain and hemo-vac. Pt not active with HH pta. He has insurance and drug coverage, He has hospital follow up. He communicates no needs or concerns about DC plan.   Action/Plan: DC home with self care. CM will follow to DC.  Expected Discharge Date:       10/31/2016           Expected Discharge Plan:  Home/Self Care  In-House Referral:  NA  Discharge planning Services  CM Consult  Post Acute Care Choice:  NA Choice offered to:  NA  Status of Service:  Completed, signed off  Sherald Barge, RN 10/29/2016, 1:50 PM

## 2016-10-29 NOTE — Discharge Summary (Deleted)
Discharge Summary  Luis Frey QPY:[id number] DOB: 1970/05/18  PCP: Jalene Mullet, PA-C  Admit date: 10/29/2016 Discharge date: 10/29/2016  Time spent: 45 minutes   Recommendations for Outpatient Follow-up:  1. Patient leaving AGAINST MEDICAL ADVICE 2. Advised to cut back on his narcotics as I'm concerned that they are making him sedated & leading to recurrent aspiration events 3. Prescription given for Levaquin 500 mg po daily x 7 days   Discharge Diagnoses:  Active Hospital Problems   Diagnosis Date Noted  . Aspiration into airway 10/29/2016  . Sepsis ([place]) 10/29/2016  . 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 Chronic pain 10/29/2016  . Protein-calorie malnutrition, severe 10/29/2016  . Abnormal LFTs 03/21/2016    Resolved Hospital Problems   Diagnosis Date Noted Date Resolved  No resolved problems to display.    Discharge Condition: advised pt not to leave, but he is insisting on ama   Diet recommendation: regular w/supplemental protein shakes   Vitals:   10/29/16 1352 10/29/16 1355  BP: 130/71 (!) 112/59  Pulse: 93 89  Resp: 20 19  Temp: 98.7 F (37.1 C) 98.5 F (36.9 C)  SpO2: 97% 98%    History of present illness:  46 year old male past mental history of pancreatic cancer status post Whipple procedure along with SMA repair following bleed who presented to the emergency room on 9/17 for one week of coughing, weakness and chills. Patient found to be in sepsis with lactic acidosis, hypotension and pulmonary source seen on CT as right mainstem bronchus debris consistent with aspiration. Patient started on aggressive fluid resuscitation and IV antibiotics.    Hospital Course:  Principal Problem: . Aspiration into airway . Sepsis ([place]) secondary to aspiration pneumonia: In discussion with patient and his wife, this is actually patient's third pneumonia in the past 6 weeks. In review of the patient's chart, he is on OxyIR 10 mg every 3 hours plus nightly Xanax.  She had my concern that likely patient is becoming too sedated from his medications leading to aspiration. They understood the severity of the sepsis. Patient met criteria for sepsis on admission given lactic acidosis, hypotension and pulmonary source. Stabilized. Lactic acid level this morning under 2. Continue IV fluids and IV antibiotics. We'll plan to discharge tomorrow on by mouth Levaquin if lab work stable. Patient not requiring additional oxygen  By afternoon, patient insisting that all of his pain medications be restarted Initially we started them back at 5 mg every 6 hours. Then increase to 10 mg every 6 hours.He wanted scheduled at 10 mg every 3 hours and she been taking at home. When I raise concerns along with the nurse that this is what initially led to him coming into the hospital in the first place and also this rather risk of dropping his blood pressure further while even with fluids, his blood pressure was in the 120s, he stated he did not care and insisted on leaving [place]. Patient given prescription sent to his pharmacy for Levaquin 750 mg by mouth daily  . Abnormal LFTs . 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 Chronic pain: Wife tells me that physicians are working to scale back his narcotics.  THC use   Protein-calorie malnutrition, severe: seen by nutrition. Patient meets criteria in the context of chronic illness. Started on ensure twice a day. Already on protein supplement shakes at home, which he has been encouraged to continue.   Procedures:  none  Consultations:  none   Discharge Exam: BP Marland Kitchen)  112/59 (BP Location: Right Arm)   Pulse 89   Temp 98.5 F (36.9 C) (Oral)   Resp 19   Ht _0  (1.803 m)   Wt 94.7 kg (208 lb 12.4 oz)   SpO2 98%   BMI 29.12 kg/m   General: alert and oriented 3  Cardiovascular: =regular rate and rhythm, S1-S2  Respiratory: clear to auscultation bilaterally   Discharge Instructions You were cared for by  a hospitalist during your hospital stay. If you have any questions about your discharge medications or the care you received while you were in the hospital after you are discharged, you can call the unit and asked to speak with the hospitalist on call if the hospitalist that took care of you is not available. Once you are discharged, your primary care physician will handle any further medical issues. Please note that NO REFILLS for any discharge medications will be authorized once you are discharged, as it is imperative that you return to your primary care physician (or establish a relationship with a primary care physician if you do not have one) for your aftercare needs so that they can reassess your need for medications and monitor your lab values.  Discharge Instructions    Diet - low sodium heart healthy    Complete by:  As directed    Increase activity slowly    Complete by:  As directed      Allergies as of 10/29/2016      Reactions   Poison Oak Extract       Medication List    STOP taking these medications   enoxaparin 40 MG/0.4ML injection Commonly known as:  LOVENOX   zolpidem 10 MG tablet Commonly known as:  AMBIEN     TAKE these medications   ALPRAZolam 0.25 MG tablet Commonly known as:  XANAX Take 0.25 mg by mouth at bedtime as needed for anxiety.   CREON 24000-76000 units Cpep Generic drug:  Pancrelipase (Lip-Prot-Amyl) Take 3 capsules by mouth 3 (three) times daily with meals.   levofloxacin 750 MG tablet Commonly known as:  LEVAQUIN Take 1 tablet (750 mg total) by mouth daily.   Oxycodone HCl 10 MG Tabs Take 10 mg by mouth every 3 (three) hours.            Discharge Care Instructions        Start     Ordered   10/29/16 0000  levofloxacin (LEVAQUIN) 750 MG tablet  Daily     10/29/16 1732   10/29/16 0000  Increase activity slowly     10/29/16 1732   10/29/16 0000  Diet - low sodium heart healthy     10/29/16 1732     Allergies  Allergen  Reactions  . Poison Oak Extract       The results of significant diagnostics from this hospitalization (including imaging, microbiology, ancillary and laboratory) are listed below for reference.    Significant Diagnostic Studies: Ct Chest W Contrast  Result Date: 10/29/2016 CLINICAL DATA:  Chest pain with palpation, shortness of breath. History of pancreatic cancer and Whipple procedure June 2018, kidney stones. EXAM: CT CHEST, ABDOMEN, AND PELVIS WITH CONTRAST TECHNIQUE: Multidetector CT imaging of the chest, abdomen and pelvis was performed following the standard protocol during bolus administration of intravenous contrast. CONTRAST:  150m ISOVUE-300 IOPAMIDOL (ISOVUE-300) INJECTION 61% COMPARISON:  CT abdomen and pelvis Jul 04, 2016 FINDINGS: CT CHEST FINDINGS CARDIOVASCULAR: Heart size is normal. No pericardial effusions. Thoracic aorta is normal course and caliber,  trace calcific atherosclerosis. No central pulmonary embolism though not tailored for evaluation. MEDIASTINUM/NODES: No mediastinal mass. No lymphadenopathy by CT size criteria. Normal appearance of thoracic esophagus though not tailored for evaluation. LUNGS/PLEURA: Nonobstructing dependent debris within RIGHT mainstem bronchus. Mild bronchial wall thickening. No pneumothorax. Bibasilar discoid atelectasis. No pleural effusion or focal consolidation. MUSCULOSKELETAL: Included soft tissues and included osseous structures appear normal. CT ABDOMEN AND PELVIS FINDINGS- streak artifact recommend as patient's arm for scanned at side of body. HEPATOBILIARY: Resolved intrahepatic biliary dilatation status post cholecystectomy. PANCREAS: Pancreatic head and proximal body resection. SPLEEN: Normal. ADRENALS/URINARY TRACT: Kidneys are orthotopic, demonstrating symmetric enhancement. 2 mm RIGHT lower pole and 3 mm LEFT upper pole nephrolithiasis, additional punctate nephrolithiasis on the LEFT. No hydronephrosis or solid renal masses. The  unopacified ureters are normal in course and caliber. Delayed imaging through the kidneys demonstrates symmetric prompt contrast excretion within the proximal urinary collecting system. Urinary bladder is partially distended and unremarkable. Normal adrenal glands. STOMACH/BOWEL: Partial gastrectomy with expected postsurgical changes of the stomach. The small and large bowel are normal in course and caliber without inflammatory changes though sensitivity decreased without oral contrast. Normal appendix. VASCULAR/LYMPHATIC: Aortoiliac vessels are normal in course and caliber. Proximal SMA short segment stent. Mild calcific atherosclerosis. No lymphadenopathy by CT size criteria. REPRODUCTIVE: Normal. OTHER: 2 perihepatic Jackson-Pratt drains in place. Small volume ascites without focal fluid collection. Mild mesenteric edema consistent with recent surgery. MUSCULOSKELETAL: Non-acute.  Anterior abdominal wall scarring. IMPRESSION: CT CHEST: 1. Debris within RIGHT mainstem bronchus consistent with aspiration. 2. Mild bronchial wall thickening seen with bronchitis or reactive airway disease. Bite basilar atelectasis. CT ABDOMEN AND PELVIS: 1. Status post recent Whipple procedure with Jackson-Pratt drains in place. Small volume ascites without drainable fluid collection. No residual biliary dilatation. 2. Bilateral small nonobstructing nephrolithiasis. Aortic Atherosclerosis (ICD10-I70.0). Electronically Signed   By: Elon Alas M.D.   On: 10/29/2016 04:49   Ct Abdomen Pelvis W Contrast  Result Date: 10/29/2016 CLINICAL DATA:  Chest pain with palpation, shortness of breath. History of pancreatic cancer and Whipple procedure June 2018, kidney stones. EXAM: CT CHEST, ABDOMEN, AND PELVIS WITH CONTRAST TECHNIQUE: Multidetector CT imaging of the chest, abdomen and pelvis was performed following the standard protocol during bolus administration of intravenous contrast. CONTRAST:  190m ISOVUE-300 IOPAMIDOL  (ISOVUE-300) INJECTION 61% COMPARISON:  CT abdomen and pelvis Jul 04, 2016 FINDINGS: CT CHEST FINDINGS CARDIOVASCULAR: Heart size is normal. No pericardial effusions. Thoracic aorta is normal course and caliber, trace calcific atherosclerosis. No central pulmonary embolism though not tailored for evaluation. MEDIASTINUM/NODES: No mediastinal mass. No lymphadenopathy by CT size criteria. Normal appearance of thoracic esophagus though not tailored for evaluation. LUNGS/PLEURA: Nonobstructing dependent debris within RIGHT mainstem bronchus. Mild bronchial wall thickening. No pneumothorax. Bibasilar discoid atelectasis. No pleural effusion or focal consolidation. MUSCULOSKELETAL: Included soft tissues and included osseous structures appear normal. CT ABDOMEN AND PELVIS FINDINGS- streak artifact recommend as patient's arm for scanned at side of body. HEPATOBILIARY: Resolved intrahepatic biliary dilatation status post cholecystectomy. PANCREAS: Pancreatic head and proximal body resection. SPLEEN: Normal. ADRENALS/URINARY TRACT: Kidneys are orthotopic, demonstrating symmetric enhancement. 2 mm RIGHT lower pole and 3 mm LEFT upper pole nephrolithiasis, additional punctate nephrolithiasis on the LEFT. No hydronephrosis or solid renal masses. The unopacified ureters are normal in course and caliber. Delayed imaging through the kidneys demonstrates symmetric prompt contrast excretion within the proximal urinary collecting system. Urinary bladder is partially distended and unremarkable. Normal adrenal glands. STOMACH/BOWEL: Partial gastrectomy with expected postsurgical changes of  the stomach. The small and large bowel are normal in course and caliber without inflammatory changes though sensitivity decreased without oral contrast. Normal appendix. VASCULAR/LYMPHATIC: Aortoiliac vessels are normal in course and caliber. Proximal SMA short segment stent. Mild calcific atherosclerosis. No lymphadenopathy by CT size criteria.  REPRODUCTIVE: Normal. OTHER: 2 perihepatic Jackson-Pratt drains in place. Small volume ascites without focal fluid collection. Mild mesenteric edema consistent with recent surgery. MUSCULOSKELETAL: Non-acute.  Anterior abdominal wall scarring. IMPRESSION: CT CHEST: 1. Debris within RIGHT mainstem bronchus consistent with aspiration. 2. Mild bronchial wall thickening seen with bronchitis or reactive airway disease. Bite basilar atelectasis. CT ABDOMEN AND PELVIS: 1. Status post recent Whipple procedure with Jackson-Pratt drains in place. Small volume ascites without drainable fluid collection. No residual biliary dilatation. 2. Bilateral small nonobstructing nephrolithiasis. Aortic Atherosclerosis (ICD10-I70.0). Electronically Signed   By: Elon Alas M.D.   On: 10/29/2016 04:49   Dg Chest Port 1 View  Result Date: 10/29/2016 CLINICAL DATA:  Sternal chest pain radiating to the back starting 1 hour ago. Nausea. Pain on palpation. Shortness of breath. Recent Whipple procedure. Code sepsis. EXAM: PORTABLE CHEST 1 VIEW COMPARISON:  None. FINDINGS: Shallow inspiration with linear atelectasis in both lung bases and in the left mid lung. Heart size and pulmonary vascularity are normal. No airspace disease or consolidation in the lungs. No blunting of costophrenic angles. No pneumothorax. Pigtail catheter in the right upper quadrant abdomen. IMPRESSION: Shallow inspiration with linear atelectasis in the lung bases. Electronically Signed   By: Lucienne Capers M.D.   On: 10/29/2016 01:44    Microbiology: Recent Results (from the past 240 hour(s))  Blood Culture (routine x 2)     Status: None (Preliminary result)   Collection Time: 10/29/16  1:05 AM  Result Value Ref Range Status   Specimen Description RIGHT ANTECUBITAL  Final   Special Requests   Final    BOTTLES DRAWN AEROBIC AND ANAEROBIC Blood Culture adequate volume DRAWN BY RN   Culture NO GROWTH < 12 HOURS  Final   Report Status PENDING  Incomplete   Blood Culture (routine x 2)     Status: None (Preliminary result)   Collection Time: 10/29/16  1:15 AM  Result Value Ref Range Status   Specimen Description BLOOD RIGHT ARM  Final   Special Requests   Final    BOTTLES DRAWN AEROBIC ONLY Blood Culture results may not be optimal due to an inadequate volume of blood received in culture bottles DRAWN BY RN   Culture NO GROWTH < 12 HOURS  Final   Report Status PENDING  Incomplete     Labs: Basic Metabolic Panel:  Recent Labs Lab 10/29/16 0105  NA 137  K 3.6  CL 99*  CO2 26  GLUCOSE 92  BUN 14  CREATININE 0.83  CALCIUM 9.3   Liver Function Tests:  Recent Labs Lab 10/29/16 0105  AST 44*  ALT 16*  ALKPHOS 189*  BILITOT 0.5  PROT 7.6  ALBUMIN 3.3*    Recent Labs Lab 10/29/16 0105  LIPASE 32   No results for input(s): AMMONIA in the last 168 hours. CBC:  Recent Labs Lab 10/29/16 0105  WBC 5.9  NEUTROABS 5.7  HGB 11.1*  HCT 37.5*  MCV 80.3  PLT 491*   Cardiac Enzymes: No results for input(s): CKTOTAL, CKMB, CKMBINDEX, TROPONINI in the last 168 hours. BNP: BNP (last 3 results) No results for input(s): BNP in the last 8760 hours.  ProBNP (last 3 results) No results for input(s):  PROBNP in the last 8760 hours.  CBG: No results for input(s): GLUCAP in the last 168 hours.     Signed:  Annita Brod, MD [facility] 10/29/2016, 5:32 PM

## 2016-10-29 NOTE — Progress Notes (Signed)
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 He is s/p whipple on 08/08/16. He presents with sepsis related to aspiration pneumonia. He is not in a very pleasant mood due to his altered pain medication regimen. He has been smoking marijuana to help with his appetite.   His appetite has been very poor- since his surgery his food tolerance of food intake has significantly diminished. He has been drinking carnation instant breakfast in the morning. He has a small serving <1/2 cup of mac and cheese and later part of a frozen banquet meal. He drinks milk and 100% grape juice mostly. He is at risk for nutritional deficiencies of fat soluble vitamins, nutritional anemias and metabolic bone disease.  His weight is down from 260# in May to 209# (95 kg) range. A decrease of 20% in 4 months.    Nutrition-Focused physical exam completed. Findings are mild to moderate fat depletion, mild muscle depletion, and no lower extremity edema.     Recent Labs Lab 10/29/16 0105  NA 137  K 3.6  CL 99*  CO2 26  BUN 14  CREATININE 0.83  CALCIUM 9.3  GLUCOSE 92     Diet Order:  Diet regular Room service appropriate? Yes; Fluid consistency: Thin  Skin:   Terrial Rhodes drains  Last BM:  unknown  Height:   Ht Readings from Last 1 Encounters:  10/29/16 5' 11"  (1.803 m)    Weight:   Wt Readings from Last 1 Encounters:  10/29/16  208 lb 12.4 oz (94.7 kg)    Ideal Body Weight:  78 kg  BMI:  Body mass index is 29.12 kg/m.  Estimated Nutritional Needs:   Kcal:  2212-2397 (MSJ+AF 1.2-1.3 )  Protein:  114-123 (1.2-1.3 gr)  Fluid:  >2 liters daily  EDUCATION NEEDS:  Provided handout specific to pt who are s/p Whipple Procedure Education needs addressed  Colman Cater MS,RD,CSG,LDN Office: [phone] Pager: [phone]

## 2016-10-29 NOTE — ED Notes (Signed)
Blood cultures x 2 have been collected. 

## 2016-10-29 NOTE — Progress Notes (Signed)
Patient refusing Labs this am. Talked with patient and explained the importance of his Lab work, patient still refused. MD notified. Will continue to monitor patient.

## 2016-10-30 DIAGNOSIS — C241 Malignant neoplasm of ampulla of Vater: Secondary | ICD-10-CM | POA: Diagnosis not present

## 2016-10-30 DIAGNOSIS — E43 Unspecified severe protein-calorie malnutrition: Secondary | ICD-10-CM | POA: Diagnosis not present

## 2016-10-30 DIAGNOSIS — A419 Sepsis, unspecified organism: Secondary | ICD-10-CM | POA: Diagnosis not present

## 2016-10-30 DIAGNOSIS — J690 Pneumonitis due to inhalation of food and vomit: Secondary | ICD-10-CM | POA: Diagnosis not present

## 2016-10-30 NOTE — Progress Notes (Signed)
Patient refused lab drawn this am for two lab technicians.

## 2016-10-30 NOTE — Progress Notes (Signed)
Discharge instructions given, verbalized understanding, out in stable condition ambulatory. 

## 2016-10-30 NOTE — Discharge Summary (Signed)
Discharge Summary  Luis Frey LNL:[id number] DOB: 09-02-1970  PCP: Jalene Mullet, PA-C  Admit date: 10/29/2016 Discharge date: 10/30/2016  Time spent: 25 minutes   Recommendations for Outpatient Follow-up:  1. Advised to cut back on his narcotics as I'm concerned that they might be making him sedated & leading to recurrent aspiration events 2. Prescription given for Levaquin 500 mg po daily x 7 days   Discharge Diagnoses:  Active Hospital Problems   Diagnosis Date Noted  . Aspiration into airway 10/29/2016  . Sepsis ([place]) 10/29/2016  . 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 Chronic pain 10/29/2016  . Protein-calorie malnutrition, severe 10/29/2016  . Abnormal LFTs 03/21/2016    Resolved Hospital Problems   Diagnosis Date Noted Date Resolved  No resolved problems to display.    Discharge Condition: Improved being discharged to home  Diet recommendation: regular w/supplemental protein shakes   Vitals:   10/29/16 2137 10/30/16 0627  BP: 118/61 120/66  Pulse: 81 80  Resp: 20 20  Temp: 98.4 F (36.9 C) 98.2 F (36.8 C)  SpO2: 97% 98%    History of present illness:  46 year old male past mental history of pancreatic cancer status post Whipple procedure along with SMA repair following bleed who presented to the emergency room on 9/17 for one week of coughing, weakness and chills. Patient found to be in sepsis with lactic acidosis, hypotension and pulmonary source seen on CT as right mainstem bronchus debris consistent with aspiration. Patient started on aggressive fluid resuscitation and IV antibiotics.    Hospital Course:  Principal Problem: . Aspiration into airway . Sepsis ([place]) secondary to aspiration pneumonia: In discussion with patient and his wife, this is actually patient's third pneumonia in the past 6 weeks. In review of the patient's chart, he is on OxyIR 10 mg every 3 hours plus nightly Xanax. She had my concern that likely patient is becoming too sedated  from his medications leading to aspiration. They understood the severity of the sepsis. Patient met criteria for sepsis on admission given lactic acidosis, hypotension and pulmonary source. Stabilized. Lactic acid level this morning under 2. Continue IV fluids and IV antibiotics. We'll plan to discharge tomorrow on by mouth Levaquin if lab work stable. Patient not requiring additional oxygen.  Pt's blood pressure slowly improved by afternoon of 9/17.  Still tenous.  He wanted his pain medication restarted & initially was upset that the dose was limited, but when he considered that if the full dose and schedule was resumed, it would affect his BP, his pain was managed by intermittent narcotics & motrin until his BP was more stable.  By following day, pt recovered much quicker than expected.  BP stable. Lactic acid resolved.  Breathing comfortably on room air & without fever > 24 hours, so felt ok to discharge home  . Abnormal LFTs . 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 Chronic pain: Wife tells me that physicians are working to scale back his narcotics.  THC use   Protein-calorie malnutrition, severe: seen by nutrition. Patient meets criteria in the context of chronic illness. Started on ensure twice a day. Already on protein supplement shakes at home, which he has been encouraged to continue.   Procedures:  none  Consultations:  none   Discharge Exam: BP 120/66 (BP Location: Right Arm)   Pulse 80   Temp 98.2 F (36.8 C) (Oral)   Resp 20   Ht '5\' 11"'  (1.803 m)   Wt 94.7 kg (208 lb 12.4  oz)   SpO2 98%   BMI 29.12 kg/m   General: alert and oriented 3  Cardiovascular: =regular rate and rhythm, S1-S2  Respiratory: clear to auscultation bilaterally   Discharge Instructions You were cared for by a hospitalist during your hospital stay. If you have any questions about your discharge medications or the care you received while you were in the hospital after you are discharged,  you can call the unit and asked to speak with the hospitalist on call if the hospitalist that took care of you is not available. Once you are discharged, your primary care physician will handle any further medical issues. Please note that NO REFILLS for any discharge medications will be authorized once you are discharged, as it is imperative that you return to your primary care physician (or establish a relationship with a primary care physician if you do not have one) for your aftercare needs so that they can reassess your need for medications and monitor your lab values.  Discharge Instructions    Diet - low sodium heart healthy    Complete by:  As directed    Increase activity slowly    Complete by:  As directed      Allergies as of 10/30/2016      Reactions   Poison Oak Extract       Medication List    STOP taking these medications   enoxaparin 40 MG/0.4ML injection Commonly known as:  LOVENOX   zolpidem 10 MG tablet Commonly known as:  AMBIEN     TAKE these medications   ALPRAZolam 0.25 MG tablet Commonly known as:  XANAX Take 0.25 mg by mouth at bedtime as needed for anxiety.   CREON 24000-76000 units Cpep Generic drug:  Pancrelipase (Lip-Prot-Amyl) Take 3 capsules by mouth 3 (three) times daily with meals.   levofloxacin 750 MG tablet Commonly known as:  LEVAQUIN Take 1 tablet (750 mg total) by mouth daily.   Oxycodone HCl 10 MG Tabs Take 10 mg by mouth every 3 (three) hours.            Discharge Care Instructions        Start     Ordered   10/29/16 0000  levofloxacin (LEVAQUIN) 750 MG tablet  Daily     10/29/16 1732   10/29/16 0000  Increase activity slowly     10/29/16 1732   10/29/16 0000  Diet - low sodium heart healthy     10/29/16 1732     Allergies  Allergen Reactions  . Poison Oak Extract    Follow-up Information    Luciana Axe, Amy H, PA-C Follow up on 11/14/2016.   Specialty:  General Practice Why:  At 10:30am Contact information: [address] [place]  [id number] [phone]            The results of significant diagnostics from this hospitalization (including imaging, microbiology, ancillary and laboratory) are listed below for reference.    Significant Diagnostic Studies: Ct Chest W Contrast  Result Date: 10/29/2016 CLINICAL DATA:  Chest pain with palpation, shortness of breath. History of pancreatic cancer and Whipple procedure June 2018, kidney stones. EXAM: CT CHEST, ABDOMEN, AND PELVIS WITH CONTRAST TECHNIQUE: Multidetector CT imaging of the chest, abdomen and pelvis was performed following the standard protocol during bolus administration of intravenous contrast. CONTRAST:  133m ISOVUE-300 IOPAMIDOL (ISOVUE-300) INJECTION 61% COMPARISON:  CT abdomen and pelvis Jul 04, 2016 FINDINGS: CT CHEST FINDINGS CARDIOVASCULAR: Heart size is normal. No  pericardial effusions. Thoracic aorta is normal course and caliber, trace calcific atherosclerosis. No central pulmonary embolism though not tailored for evaluation. MEDIASTINUM/NODES: No mediastinal mass. No lymphadenopathy by CT size criteria. Normal appearance of thoracic esophagus though not tailored for evaluation. LUNGS/PLEURA: Nonobstructing dependent debris within RIGHT mainstem bronchus. Mild bronchial wall thickening. No pneumothorax. Bibasilar discoid atelectasis. No pleural effusion or focal consolidation. MUSCULOSKELETAL: Included soft tissues and included osseous structures appear normal. CT ABDOMEN AND PELVIS FINDINGS- streak artifact recommend as patient's arm for scanned at side of body. HEPATOBILIARY: Resolved intrahepatic biliary dilatation status post cholecystectomy. PANCREAS: Pancreatic head and proximal body resection. SPLEEN: Normal. ADRENALS/URINARY TRACT: Kidneys are orthotopic, demonstrating symmetric enhancement. 2 mm RIGHT lower pole and 3 mm LEFT upper pole nephrolithiasis, additional punctate nephrolithiasis on the LEFT. No hydronephrosis or  solid renal masses. The unopacified ureters are normal in course and caliber. Delayed imaging through the kidneys demonstrates symmetric prompt contrast excretion within the proximal urinary collecting system. Urinary bladder is partially distended and unremarkable. Normal adrenal glands. STOMACH/BOWEL: Partial gastrectomy with expected postsurgical changes of the stomach. The small and large bowel are normal in course and caliber without inflammatory changes though sensitivity decreased without oral contrast. Normal appendix. VASCULAR/LYMPHATIC: Aortoiliac vessels are normal in course and caliber. Proximal SMA short segment stent. Mild calcific atherosclerosis. No lymphadenopathy by CT size criteria. REPRODUCTIVE: Normal. OTHER: 2 perihepatic Jackson-Pratt drains in place. Small volume ascites without focal fluid collection. Mild mesenteric edema consistent with recent surgery. MUSCULOSKELETAL: Non-acute.  Anterior abdominal wall scarring. IMPRESSION: CT CHEST: 1. Debris within RIGHT mainstem bronchus consistent with aspiration. 2. Mild bronchial wall thickening seen with bronchitis or reactive airway disease. Bite basilar atelectasis. CT ABDOMEN AND PELVIS: 1. Status post recent Whipple procedure with Jackson-Pratt drains in place. Small volume ascites without drainable fluid collection. No residual biliary dilatation. 2. Bilateral small nonobstructing nephrolithiasis. Aortic Atherosclerosis (ICD10-I70.0). Electronically Signed   By: Elon Alas M.D.   On: 10/29/2016 04:49   Ct Abdomen Pelvis W Contrast  Result Date: 10/29/2016 CLINICAL DATA:  Chest pain with palpation, shortness of breath. History of pancreatic cancer and Whipple procedure June 2018, kidney stones. EXAM: CT CHEST, ABDOMEN, AND PELVIS WITH CONTRAST TECHNIQUE: Multidetector CT imaging of the chest, abdomen and pelvis was performed following the standard protocol during bolus administration of intravenous contrast. CONTRAST:  178m  ISOVUE-300 IOPAMIDOL (ISOVUE-300) INJECTION 61% COMPARISON:  CT abdomen and pelvis Jul 04, 2016 FINDINGS: CT CHEST FINDINGS CARDIOVASCULAR: Heart size is normal. No pericardial effusions. Thoracic aorta is normal course and caliber, trace calcific atherosclerosis. No central pulmonary embolism though not tailored for evaluation. MEDIASTINUM/NODES: No mediastinal mass. No lymphadenopathy by CT size criteria. Normal appearance of thoracic esophagus though not tailored for evaluation. LUNGS/PLEURA: Nonobstructing dependent debris within RIGHT mainstem bronchus. Mild bronchial wall thickening. No pneumothorax. Bibasilar discoid atelectasis. No pleural effusion or focal consolidation. MUSCULOSKELETAL: Included soft tissues and included osseous structures appear normal. CT ABDOMEN AND PELVIS FINDINGS- streak artifact recommend as patient's arm for scanned at side of body. HEPATOBILIARY: Resolved intrahepatic biliary dilatation status post cholecystectomy. PANCREAS: Pancreatic head and proximal body resection. SPLEEN: Normal. ADRENALS/URINARY TRACT: Kidneys are orthotopic, demonstrating symmetric enhancement. 2 mm RIGHT lower pole and 3 mm LEFT upper pole nephrolithiasis, additional punctate nephrolithiasis on the LEFT. No hydronephrosis or solid renal masses. The unopacified ureters are normal in course and caliber. Delayed imaging through the kidneys demonstrates symmetric prompt contrast excretion within the proximal urinary collecting system. Urinary bladder is partially distended and unremarkable. Normal adrenal  glands. STOMACH/BOWEL: Partial gastrectomy with expected postsurgical changes of the stomach. The small and large bowel are normal in course and caliber without inflammatory changes though sensitivity decreased without oral contrast. Normal appendix. VASCULAR/LYMPHATIC: Aortoiliac vessels are normal in course and caliber. Proximal SMA short segment stent. Mild calcific atherosclerosis. No lymphadenopathy by CT  size criteria. REPRODUCTIVE: Normal. OTHER: 2 perihepatic Jackson-Pratt drains in place. Small volume ascites without focal fluid collection. Mild mesenteric edema consistent with recent surgery. MUSCULOSKELETAL: Non-acute.  Anterior abdominal wall scarring. IMPRESSION: CT CHEST: 1. Debris within RIGHT mainstem bronchus consistent with aspiration. 2. Mild bronchial wall thickening seen with bronchitis or reactive airway disease. Bite basilar atelectasis. CT ABDOMEN AND PELVIS: 1. Status post recent Whipple procedure with Jackson-Pratt drains in place. Small volume ascites without drainable fluid collection. No residual biliary dilatation. 2. Bilateral small nonobstructing nephrolithiasis. Aortic Atherosclerosis (ICD10-I70.0). Electronically Signed   By: Elon Alas M.D.   On: 10/29/2016 04:49   Dg Chest Port 1 View  Result Date: 10/29/2016 CLINICAL DATA:  Sternal chest pain radiating to the back starting 1 hour ago. Nausea. Pain on palpation. Shortness of breath. Recent Whipple procedure. Code sepsis. EXAM: PORTABLE CHEST 1 VIEW COMPARISON:  None. FINDINGS: Shallow inspiration with linear atelectasis in both lung bases and in the left mid lung. Heart size and pulmonary vascularity are normal. No airspace disease or consolidation in the lungs. No blunting of costophrenic angles. No pneumothorax. Pigtail catheter in the right upper quadrant abdomen. IMPRESSION: Shallow inspiration with linear atelectasis in the lung bases. Electronically Signed   By: Lucienne Capers M.D.   On: 10/29/2016 01:44    Microbiology: Recent Results (from the past 240 hour(s))  Blood Culture (routine x 2)     Status: None (Preliminary result)   Collection Time: 10/29/16  1:05 AM  Result Value Ref Range Status   Specimen Description RIGHT ANTECUBITAL  Final   Special Requests   Final    BOTTLES DRAWN AEROBIC AND ANAEROBIC Blood Culture adequate volume DRAWN BY RN   Culture NO GROWTH 1 DAY  Final   Report Status PENDING   Incomplete  Blood Culture (routine x 2)     Status: None (Preliminary result)   Collection Time: 10/29/16  1:15 AM  Result Value Ref Range Status   Specimen Description BLOOD RIGHT ARM  Final   Special Requests   Final    BOTTLES DRAWN AEROBIC ONLY Blood Culture results may not be optimal due to an inadequate volume of blood received in culture bottles DRAWN BY RN   Culture NO GROWTH 1 DAY  Final   Report Status PENDING  Incomplete     Labs: Basic Metabolic Panel:  Recent Labs Lab 10/29/16 0105  NA 137  K 3.6  CL 99*  CO2 26  GLUCOSE 92  BUN 14  CREATININE 0.83  CALCIUM 9.3   Liver Function Tests:  Recent Labs Lab 10/29/16 0105  AST 44*  ALT 16*  ALKPHOS 189*  BILITOT 0.5  PROT 7.6  ALBUMIN 3.3*    Recent Labs Lab 10/29/16 0105  LIPASE 32   No results for input(s): AMMONIA in the last 168 hours. CBC:  Recent Labs Lab 10/29/16 0105  WBC 5.9  NEUTROABS 5.7  HGB 11.1*  HCT 37.5*  MCV 80.3  PLT 491*   Cardiac Enzymes: No results for input(s): CKTOTAL, CKMB, CKMBINDEX, TROPONINI in the last 168 hours. BNP: BNP (last 3 results) No results for input(s): BNP in the last 8760 hours.  ProBNP (  last 3 results) No results for input(s): PROBNP in the last 8760 hours.  CBG: No results for input(s): GLUCAP in the last 168 hours.     Signed:  Annita Brod, MD [facility] 10/30/2016, 11:08 AM

## 2016-11-03 LAB — CULTURE, BLOOD (ROUTINE X 2)
CULTURE: NO GROWTH
Culture: NO GROWTH

## 2016-12-08 ENCOUNTER — Emergency Department (HOSPITAL_COMMUNITY): Payer: PRIVATE HEALTH INSURANCE

## 2016-12-08 ENCOUNTER — Encounter (HOSPITAL_COMMUNITY): Payer: Self-pay | Admitting: Emergency Medicine

## 2016-12-08 ENCOUNTER — Emergency Department (HOSPITAL_COMMUNITY)
Admission: EM | Admit: 2016-12-08 | Discharge: 2016-12-08 | Disposition: A | Discharge status: Other Acute Inpatient Hospital | Payer: PRIVATE HEALTH INSURANCE | Attending: General Surgery | Admitting: General Surgery

## 2016-12-08 DIAGNOSIS — K8689 Other specified diseases of pancreas: Secondary | ICD-10-CM

## 2016-12-08 DIAGNOSIS — K625 Hemorrhage of anus and rectum: Secondary | ICD-10-CM | POA: Diagnosis present

## 2016-12-08 DIAGNOSIS — K922 Gastrointestinal hemorrhage, unspecified: Secondary | ICD-10-CM | POA: Insufficient documentation

## 2016-12-08 DIAGNOSIS — D017 Carcinoma in situ of other specified digestive organs: Secondary | ICD-10-CM | POA: Insufficient documentation

## 2016-12-08 DIAGNOSIS — K869 Disease of pancreas, unspecified: Secondary | ICD-10-CM | POA: Insufficient documentation

## 2016-12-08 DIAGNOSIS — R079 Chest pain, unspecified: Secondary | ICD-10-CM

## 2016-12-08 DIAGNOSIS — Z87891 Personal history of nicotine dependence: Secondary | ICD-10-CM | POA: Insufficient documentation

## 2016-12-08 DIAGNOSIS — Z79899 Other long term (current) drug therapy: Secondary | ICD-10-CM | POA: Diagnosis not present

## 2016-12-08 HISTORY — DX: Other specified diseases of pancreas: K86.89

## 2016-12-08 HISTORY — DX: Pneumonitis due to inhalation of food and vomit: J69.0

## 2016-12-08 HISTORY — DX: Other chronic pain: G89.29

## 2016-12-08 LAB — CBC WITH DIFFERENTIAL/PLATELET
BASOS ABS: 0 10*3/uL (ref 0.0–0.1)
BASOS PCT: 0 %
EOS ABS: 0 10*3/uL (ref 0.0–0.7)
EOS PCT: 0 %
HCT: 32 % — ABNORMAL LOW (ref 39.0–52.0)
Hemoglobin: 10 g/dL — ABNORMAL LOW (ref 13.0–17.0)
LYMPHS PCT: 14 %
Lymphs Abs: 1.3 10*3/uL (ref 0.7–4.0)
MCH: 24.6 pg — ABNORMAL LOW (ref 26.0–34.0)
MCHC: 31.3 g/dL (ref 30.0–36.0)
MCV: 78.8 fL (ref 78.0–100.0)
MONO ABS: 0.7 10*3/uL (ref 0.1–1.0)
Monocytes Relative: 7 %
Neutro Abs: 7.5 10*3/uL (ref 1.7–7.7)
Neutrophils Relative %: 79 %
PLATELETS: 248 10*3/uL (ref 150–400)
RBC: 4.06 MIL/uL — AB (ref 4.22–5.81)
RDW: 16.7 % — AB (ref 11.5–15.5)
WBC: 9.5 10*3/uL (ref 4.0–10.5)

## 2016-12-08 LAB — COMPREHENSIVE METABOLIC PANEL
ALBUMIN: 3.4 g/dL — AB (ref 3.5–5.0)
ALK PHOS: 182 U/L — AB (ref 38–126)
ALT: 32 U/L (ref 17–63)
AST: 34 U/L (ref 15–41)
Anion gap: 11 (ref 5–15)
BILIRUBIN TOTAL: 0.7 mg/dL (ref 0.3–1.2)
BUN: 12 mg/dL (ref 6–20)
CALCIUM: 9.3 mg/dL (ref 8.9–10.3)
CO2: 24 mmol/L (ref 22–32)
Chloride: 102 mmol/L (ref 101–111)
Creatinine, Ser: 0.66 mg/dL (ref 0.61–1.24)
GFR calc Af Amer: >60 mL/min (ref >60)
GLUCOSE: 120 mg/dL — AB (ref 65–99)
POTASSIUM: 3.8 mmol/L (ref 3.5–5.1)
Sodium: 137 mmol/L (ref 135–145)
TOTAL PROTEIN: 7.4 g/dL (ref 6.5–8.1)

## 2016-12-08 LAB — TYPE AND SCREEN
ABO/RH(D): O POS
Antibody Screen: NEGATIVE

## 2016-12-08 LAB — TROPONIN I: Troponin I: <0.03 ng/mL (ref <0.03)

## 2016-12-08 LAB — LIPASE, BLOOD: Lipase: 16 U/L (ref 11–51)

## 2016-12-08 LAB — PROTIME-INR
INR: 1.11
PROTHROMBIN TIME: 14.2 s (ref 11.4–15.2)

## 2016-12-08 LAB — POC OCCULT BLOOD, ED: FECAL OCCULT BLD: POSITIVE — AB

## 2016-12-08 MED ORDER — HYDROMORPHONE HCL 1 MG/ML IJ SOLN
0.5000 mg | Freq: Once | INTRAMUSCULAR | Status: AC
  Administered 2016-12-08: 0.5 mg via INTRAVENOUS
  Filled 2016-12-08: qty 1

## 2016-12-08 MED ORDER — ONDANSETRON 4 MG PO TBDP
4.0000 mg | ORAL_TABLET | Freq: Once | ORAL | Status: AC
  Administered 2016-12-08: 4 mg via ORAL
  Filled 2016-12-08: qty 1

## 2016-12-08 MED ORDER — IOPAMIDOL (ISOVUE-300) INJECTION 61%
100.0000 mL | Freq: Once | INTRAVENOUS | Status: AC | PRN
  Administered 2016-12-08: 100 mL via INTRAVENOUS

## 2016-12-08 MED ORDER — SODIUM CHLORIDE 0.9 % IV BOLUS (SEPSIS)
1000.0000 mL | Freq: Once | INTRAVENOUS | Status: AC
  Administered 2016-12-08: 1000 mL via INTRAVENOUS

## 2016-12-08 MED ORDER — MORPHINE SULFATE (PF) 4 MG/ML IV SOLN
4.0000 mg | Freq: Once | INTRAVENOUS | Status: AC
  Administered 2016-12-08: 4 mg via INTRAVENOUS
  Filled 2016-12-08: qty 1

## 2016-12-08 MED ORDER — PENTAFLUOROPROP-TETRAFLUOROETH EX AERO: INHALATION_SPRAY | CUTANEOUS | Status: DC | PRN

## 2016-12-08 MED ORDER — FAMOTIDINE IN NACL 20-0.9 MG/50ML-% IV SOLN
20.0000 mg | Freq: Once | INTRAVENOUS | Status: AC
  Administered 2016-12-08: 20 mg via INTRAVENOUS
  Filled 2016-12-08: qty 50

## 2016-12-08 MED ORDER — PENTAFLUOROPROP-TETRAFLUOROETH EX AERO
INHALATION_SPRAY | CUTANEOUS | Status: AC
  Filled 2016-12-08: qty 103.5

## 2016-12-08 NOTE — ED Notes (Signed)
Report to [employer] care

## 2016-12-08 NOTE — ED Notes (Signed)
Out of bed to BR- pt and spouse report D with blood

## 2016-12-08 NOTE — ED Notes (Signed)
Attempted IV times 2.  Difficult to advance catheters.

## 2016-12-08 NOTE — ED Notes (Signed)
Called [facility] earlier for transport. And they suggested we call [facility] for transport.  Mina Marble will send a truck.  Nurse informed.

## 2016-12-08 NOTE — ED Notes (Signed)
Pt and spouse updated re aircare enroute ad report given Should be here around 20-30 min

## 2016-12-08 NOTE — ED Notes (Signed)
Pt went to CT

## 2016-12-08 NOTE — ED Triage Notes (Addendum)
Pt brought in by EMS due bright red stool in blood last night and again this am. Pt lightheaded upon transfer. Complaining of right sided pain. Nauseated at this time. Took zofran at home. Pt dx with colon cancer and recently had whipple procedure

## 2016-12-08 NOTE — ED Notes (Signed)
Luis Frey attempting IV.

## 2016-12-08 NOTE — ED Notes (Signed)
AC, Tawnya Crook attempting IV.

## 2016-12-08 NOTE — ED Provider Notes (Signed)
[facility] EMERGENCY DEPARTMENT Provider Note   CSN: [id number] Arrival date & time: 12/08/16  2694     History   Chief Complaint Chief Complaint  Patient presents with  . Blood In Stools    HPI Luis Frey is a 46 y.o. 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  He endorses subjective fevers and chills for the past 3 days.  He endorses feeling lightheaded with positional changes.  Since arriving here he also endorses sharp left-sided chest pain along with shortness of breath.  He has had a chronic cough productive of clear sputum.  Past medical history is also significant for aspiration pneumonia, GERD and kidney stones.  He has had no treatments and has found no alleviators for symptoms.  The history is provided by the patient.    Past Medical History:  Diagnosis Date  . Ampullary carcinoma (HCC)    pancreatic   . Anxiety   . Aspiration pneumonia ([place])   . Chronic pain   . GERD (gastroesophageal reflux disease)   . History of kidney stones   . Pancreatic fluid leak 08/2016   s/p whipple    Patient Active Problem List   Diagnosis Date Noted  . Aspiration into airway 10/29/2016  . Sepsis ([place]) 10/29/2016  . Ampullary carcinoma ([place]) 10/29/2016  . Chronic pain 10/29/2016  . Protein-calorie malnutrition, severe 10/29/2016  . Dilated bile duct 07/05/2016  . Biliary obstruction 07/05/2016  . Abnormal LFTs 03/21/2016  . Anemia 03/21/2016  . Melena 03/21/2016    Past Surgical History:  Procedure Laterality Date  . BILIARY STENT PLACEMENT N/A 07/06/2016   Procedure: BILIARY STENT PLACEMENT;  Surgeon: Daneil Dolin, MD;  Location: [facility]  ENDO SUITE;  Service: Endoscopy;  Laterality: N/A;  . BIOPSY  07/06/2016   Procedure: BIOPSY ampullary;  Surgeon: Daneil Dolin, MD;  Location: [facility] ENDO SUITE;  Service: Endoscopy;;  . ERCP N/A 07/06/2016   Procedure: ENDOSCOPIC RETROGRADE CHOLANGIOPANCREATOGRAPHY (ERCP);  Surgeon: Daneil Dolin, MD;  Location: [facility] ENDO SUITE;  Service: Endoscopy;  Laterality: N/A;  130   . SPHINCTEROTOMY  07/06/2016   Procedure: SPHINCTEROTOMY;  Surgeon: Daneil Dolin, MD;  Location: [facility] ENDO SUITE;  Service: Endoscopy;;  . [place] ARTERY STENT  08/2016   [facility]  . WHIPPLE PROCEDURE  08/08/2016   Dr. Crisoforo Oxford at [facility] Medications    Prior to Admission medications   Medication Sig Start Date End Date Taking? Authorizing Provider  ALPRAZolam Duanne Moron) 0.25 MG tablet Take 0.25 mg by mouth at bedtime as needed for anxiety.   Yes [provider]  CREON 24000-76000 units CPEP Take 3 capsules by mouth 3 (three) times daily with meals. 10/11/16  Yes [provider]  Oxycodone HCl 10 MG TABS Take 10 mg by mouth every 4 (four) hours.    Yes [provider]  pantoprazole (PROTONIX) 40 MG tablet Take 40 mg by mouth 2 (two) times daily.   Yes [provider]  dronabinol (MARINOL) 2.5 MG capsule Take 2.5 mg by mouth 2 (two) times daily before a meal. 12/05/16 01/04/17  [provider]  oxycodone-acetaminophen (LYNOX) 10-300 MG tablet Take 1 tablet by mouth every  4 (four) hours as needed for pain.    [provider]    Family History Family History  Problem Relation Age of Onset  . Liver disease Neg Hx     Social History Social History  Substance Use Topics  . Smoking status: Former Smoker    Packs/day: 1.00    Years: 24.00    Types: Cigarettes    Quit date: 07/06/2011  . Smokeless tobacco: Former Systems developer    Types: Snuff    Quit date: 07/06/1982  . Alcohol use No     Comment: socially , moonshine once every 3 months      Allergies   Poison oak extract   Review of Systems Review of Systems  Constitutional: Positive for chills and fever.  HENT: Negative for congestion and sore throat.   Eyes: Negative.   Respiratory: Positive for cough and shortness of breath. Negative for chest tightness.   Cardiovascular: Positive for chest pain.  Gastrointestinal: Positive for abdominal pain, blood in stool, diarrhea, nausea and vomiting.  Genitourinary: Negative.   Musculoskeletal: Negative for arthralgias, joint swelling and neck pain.  Skin: Negative.  Negative for rash and wound.  Neurological: Positive for light-headedness. Negative for dizziness, weakness, numbness and headaches.  Psychiatric/Behavioral: Negative.      Physical Exam Updated Vital Signs BP (!) 113/56   Pulse 98   Temp 98.7 F (37.1 C) (Oral)   Resp 20   Ht 5\' 11"  (1.803 m)   Wt 90.7 kg (200 lb)   SpO2 (!) 79%   BMI 27.89 kg/m   Physical Exam  Constitutional: He appears well-developed and well-nourished.  HENT:  Head: Normocephalic and atraumatic.  Eyes: Conjunctivae are normal.  Neck: Normal range of motion.  Cardiovascular: Normal rate, regular rhythm, normal heart sounds and intact distal pulses.   Pulmonary/Chest: Effort normal and breath sounds normal. He has no wheezes.  Abdominal: Soft. Bowel sounds are normal. He exhibits no pulsatile liver, no fluid wave, no pulsatile midline mass and no mass. There is generalized tenderness. There is guarding. There is no rigidity and no rebound.  Musculoskeletal: Normal range of motion.  Neurological: He is alert.  Skin: Skin is warm and dry.  Psychiatric: He has a normal mood and affect.  Nursing note and vitals reviewed.    ED Treatments / Results  Labs (all labs ordered are listed, but only abnormal results are displayed) Labs Reviewed  COMPREHENSIVE METABOLIC PANEL - Abnormal; Notable for the following:       Result Value   Glucose, Bld 120 (*)    Albumin 3.4 (*)     Alkaline Phosphatase 182 (*)    All other components within normal limits  CBC WITH DIFFERENTIAL/PLATELET - Abnormal; Notable for the following:    RBC 4.06 (*)    Hemoglobin 10.0 (*)    HCT 32.0 (*)    MCH 24.6 (*)    RDW 16.7 (*)    All other components within normal limits  POC OCCULT BLOOD, ED - Abnormal; Notable for the following:    Fecal Occult Bld POSITIVE (*)    All other components within normal limits  PROTIME-INR  LIPASE, BLOOD  TROPONIN I  OCCULT BLOOD X 1 CARD TO LAB, STOOL  TYPE AND SCREEN    EKG  EKG Interpretation  Date/Time:  Saturday December 08 2016 09:15:26 EDT Ventricular Rate:  111 PR Interval:    QRS Duration: 83 QT Interval:  314 QTC Calculation: 427 R Axis:   84 Text  Interpretation:  Sinus tachycardia When compared with ECG of 10/29/2016 No significant change was found Confirmed by Francine Graven [phone]) on 12/08/2016 9:30:57 AM       Radiology Dg Chest 1 View  Result Date: 12/08/2016 CLINICAL DATA:  Chest pain and shortness of breath. EXAM: CHEST 1 VIEW COMPARISON:  Chest x-ray dated October 29, 2016. FINDINGS: The cardiomediastinal silhouette is normal in size. Normal pulmonary vascularity. Low lung volumes with bibasilar atelectasis. No focal consolidation, pleural effusion, or pneumothorax. No acute osseous abnormality. IMPRESSION: No active disease. Electronically Signed   By: Titus Dubin M.D.   On: 12/08/2016 12:41   Ct Chest W Contrast  Result Date: 12/08/2016 CLINICAL DATA:  Bright red rectal bleeding this morning. Right-sided abdominal pain with nausea. History of pancreatic cancer with Whipple procedure. EXAM: CT CHEST, ABDOMEN, AND PELVIS WITH CONTRAST TECHNIQUE: Multidetector CT imaging of the chest, abdomen and pelvis was performed following the standard protocol during bolus administration of intravenous contrast. CONTRAST:  142mL ISOVUE-300 IOPAMIDOL (ISOVUE-300) INJECTION 61% COMPARISON:  Prior studies 10/29/2016.  FINDINGS: CT CHEST FINDINGS Cardiovascular: There are no significant vascular findings. The heart size is normal. There is no pericardial effusion. Mediastinum/Nodes: There are no enlarged mediastinal, hilar or axillary lymph nodes. The thyroid gland, trachea and esophagus demonstrate no significant findings. Lungs/Pleura: There is no pleural effusion. Interval improved aeration of the lung bases with mild residual atelectasis. No confluent airspace opacity or suspicious pulmonary nodule. Musculoskeletal/Chest wall: No chest wall mass or suspicious osseous findings. Bilateral gynecomastia. CT ABDOMEN AND PELVIS FINDINGS Hepatobiliary: No focal hepatic abnormalities are identified. There is no significant biliary dilatation. No pneumobilia identified following Whipple procedure. Pancreas: Status post Whipple procedure. The pancreatic body and tail appear stable. There is an enlarging retroperitoneal mass surrounding the SMA stent, further described below. Spleen: Normal in size without suspicious abnormality. Small low-density lesion centrally is stable. There is a small splenule. Adrenals/Urinary Tract: Both adrenal glands appear normal. There are small nonobstructing bilateral renal calculi. No evidence of ureteral calculus, hydronephrosis or delay in contrast excretion. There is bladder wall thickening which is in part secondary to incomplete distention. No surrounding inflammation. Stomach/Bowel: No evidence of bowel wall thickening, distention or surrounding inflammatory change. The appendix appears normal. Stable postsurgical changes from previous Whipple procedure. Vascular/Lymphatic: Within the surgical bed, there is an enlarging soft tissue mass encasing the SMA stent. This measures up to 3.7 x 4.5 cm transverse on image 67 of series 2. It extends 6.8 cm cephalocaudad on coronal image 69. The SMA stent appears patent, although there is suggested stenosis of the SMA just distal to the stent. No evidence of  arterial occlusion. However, the superior mesenteric vein appears occluded. The portal and splenic veins are patent. There is mild iliac atherosclerosis. Apart from the dominant retroperitoneal mass, there are only scattered small mesenteric lymph nodes which are stable. Reproductive: The prostate gland and seminal vesicles appear normal. Other: There is no significant ascites. There is mild mesenteric edema without focal fluid collection. Musculoskeletal: No acute or significant osseous findings. IMPRESSION: 1. No clear explanation for rectal bleeding identified. 2. Findings are highly worrisome for pancreatic recurrence in the surgical bed, with encasement of the superior mesenteric artery and vein. 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 3. No distant metastases identified. 4. Bladder wall thickening, likely in part secondary to incomplete distention. 5. No significant chest findings. Electronically Signed   By: Richardean Sale  M.D.   On: 12/08/2016 12:44   Ct Abdomen Pelvis W Contrast  Result Date: 12/08/2016 CLINICAL DATA:  Bright red rectal bleeding this morning. Right-sided abdominal pain with nausea. History of pancreatic cancer with Whipple procedure. EXAM: CT CHEST, ABDOMEN, AND PELVIS WITH CONTRAST TECHNIQUE: Multidetector CT imaging of the chest, abdomen and pelvis was performed following the standard protocol during bolus administration of intravenous contrast. CONTRAST:  122mL ISOVUE-300 IOPAMIDOL (ISOVUE-300) INJECTION 61% COMPARISON:  Prior studies 10/29/2016. FINDINGS: CT CHEST FINDINGS Cardiovascular: There are no significant vascular findings. The heart size is normal. There is no pericardial effusion. Mediastinum/Nodes: There are no enlarged mediastinal, hilar or axillary lymph nodes. The thyroid gland, trachea and esophagus demonstrate no significant findings. Lungs/Pleura: There is no pleural effusion. Interval improved  aeration of the lung bases with mild residual atelectasis. No confluent airspace opacity or suspicious pulmonary nodule. Musculoskeletal/Chest wall: No chest wall mass or suspicious osseous findings. Bilateral gynecomastia. CT ABDOMEN AND PELVIS FINDINGS Hepatobiliary: No focal hepatic abnormalities are identified. There is no significant biliary dilatation. No pneumobilia identified following Whipple procedure. Pancreas: Status post Whipple procedure. The pancreatic body and tail appear stable. There is an enlarging retroperitoneal mass surrounding the SMA stent, further described below. Spleen: Normal in size without suspicious abnormality. Small low-density lesion centrally is stable. There is a small splenule. Adrenals/Urinary Tract: Both adrenal glands appear normal. There are small nonobstructing bilateral renal calculi. No evidence of ureteral calculus, hydronephrosis or delay in contrast excretion. There is bladder wall thickening which is in part secondary to incomplete distention. No surrounding inflammation. Stomach/Bowel: No evidence of bowel wall thickening, distention or surrounding inflammatory change. The appendix appears normal. Stable postsurgical changes from previous Whipple procedure. Vascular/Lymphatic: Within the surgical bed, there is an enlarging soft tissue mass encasing the SMA stent. This measures up to 3.7 x 4.5 cm transverse on image 67 of series 2. It extends 6.8 cm cephalocaudad on coronal image 69. The SMA stent appears patent, although there is suggested stenosis of the SMA just distal to the stent. No evidence of arterial occlusion. However, the superior mesenteric vein appears occluded. The portal and splenic veins are patent. There is mild iliac atherosclerosis. Apart from the dominant retroperitoneal mass, there are only scattered small mesenteric lymph nodes which are stable. Reproductive: The prostate gland and seminal vesicles appear normal. Other: There is no significant  ascites. There is mild mesenteric edema without focal fluid collection. Musculoskeletal: No acute or significant osseous findings. IMPRESSION: 1. No clear explanation for rectal bleeding identified. 2. Findings are highly worrisome for pancreatic recurrence in the surgical bed, with encasement of the superior mesenteric artery and vein. 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 3. No distant metastases identified. 4. Bladder wall thickening, likely in part secondary to incomplete distention. 5. No significant chest findings. Electronically Signed   By: Richardean Sale M.D.   On: 12/08/2016 12:44    Procedures Procedures (including critical care time)  Medications Ordered in ED Medications  pentafluoroprop-tetrafluoroeth (GEBAUERS) aerosol ( Topical Not Given 12/08/16 1115)  HYDROmorphone (DILAUDID) injection 0.5 mg (not administered)  famotidine (PEPCID) IVPB 20 mg premix (0 mg Intravenous Stopped 12/08/16 1103)  ondansetron (ZOFRAN-ODT) disintegrating tablet 4 mg (4 mg Oral Given 12/08/16 1033)  morphine 4 MG/ML injection 4 mg (4 mg Intravenous Given 12/08/16 1033)  iopamidol (ISOVUE-300) 61 % injection 100 mL (100 mLs Intravenous Contrast Given 12/08/16 1158)  morphine 4 MG/ML injection 4  mg (4 mg Intravenous Given 12/08/16 1245)     Initial Impression / Assessment and Plan / ED Course  I have reviewed the triage vital signs and the nursing notes.  Pertinent labs & imaging results that were available during my care of the patient were reviewed by me and considered in my medical decision making (see chart for details).     Discussed case with Dr Eugenia Pancoast with [facility] gen surg.  Accepts pt for transfer to ICU bed [facility].  Requested powershare of CT images, discussed with rad who will do this, also CT to disk to send with pt.   Final Clinical Impressions(s) / ED Diagnoses   Final diagnoses:  Pancreatic mass   Gastrointestinal hemorrhage, unspecified gastrointestinal hemorrhage type    New Prescriptions New Prescriptions   No medications on file     Landis Martins 12/08/16 [place], [place], DO 12/11/16 1553

## 2016-12-08 NOTE — ED Notes (Signed)
This nurse attempted to get IV on patient. He wouldn't allow me to get blood or start another IV. Pt talked into IV and this nurse made two attempts and got back 2 vials of blood and IV infiltrated.   Charge Nurse made aware and attempting IV with [place].

## 2016-12-08 NOTE — ED Notes (Signed)
Dr Lita Mains in to reassess

## 2016-12-08 NOTE — ED Notes (Signed)
Awaiting transport to [facility]

## 2017-03-16 ENCOUNTER — Other Ambulatory Visit: Payer: Self-pay

## 2017-03-16 ENCOUNTER — Emergency Department (HOSPITAL_COMMUNITY)
Admission: EM | Admit: 2017-03-16 | Discharge: 2017-03-16 | Disposition: A | Discharge status: Other Acute Inpatient Hospital | Payer: BLUE CROSS/BLUE SHIELD | Attending: Emergency Medicine | Admitting: Emergency Medicine

## 2017-03-16 ENCOUNTER — Encounter (HOSPITAL_COMMUNITY): Payer: Self-pay

## 2017-03-16 ENCOUNTER — Emergency Department (HOSPITAL_COMMUNITY): Payer: BLUE CROSS/BLUE SHIELD

## 2017-03-16 DIAGNOSIS — R188 Other ascites: Secondary | ICD-10-CM | POA: Diagnosis not present

## 2017-03-16 DIAGNOSIS — K381 Appendicular concretions: Secondary | ICD-10-CM | POA: Diagnosis not present

## 2017-03-16 DIAGNOSIS — R1909 Other intra-abdominal and pelvic swelling, mass and lump: Secondary | ICD-10-CM | POA: Diagnosis not present

## 2017-03-16 DIAGNOSIS — I10 Essential (primary) hypertension: Secondary | ICD-10-CM | POA: Diagnosis not present

## 2017-03-16 DIAGNOSIS — Z91048 Other nonmedicinal substance allergy status: Secondary | ICD-10-CM | POA: Diagnosis not present

## 2017-03-16 DIAGNOSIS — R05 Cough: Secondary | ICD-10-CM | POA: Diagnosis not present

## 2017-03-16 DIAGNOSIS — Z87891 Personal history of nicotine dependence: Secondary | ICD-10-CM | POA: Insufficient documentation

## 2017-03-16 DIAGNOSIS — Z8507 Personal history of malignant neoplasm of pancreas: Secondary | ICD-10-CM | POA: Diagnosis not present

## 2017-03-16 DIAGNOSIS — M542 Cervicalgia: Secondary | ICD-10-CM | POA: Diagnosis not present

## 2017-03-16 DIAGNOSIS — K219 Gastro-esophageal reflux disease without esophagitis: Secondary | ICD-10-CM | POA: Diagnosis not present

## 2017-03-16 DIAGNOSIS — B952 Enterococcus as the cause of diseases classified elsewhere: Secondary | ICD-10-CM | POA: Diagnosis not present

## 2017-03-16 DIAGNOSIS — K3589 Other acute appendicitis without perforation or gangrene: Secondary | ICD-10-CM | POA: Diagnosis not present

## 2017-03-16 DIAGNOSIS — Z888 Allergy status to other drugs, medicaments and biological substances status: Secondary | ICD-10-CM | POA: Diagnosis not present

## 2017-03-16 DIAGNOSIS — K3533 Acute appendicitis with perforation and localized peritonitis, with abscess: Secondary | ICD-10-CM | POA: Diagnosis not present

## 2017-03-16 DIAGNOSIS — R1031 Right lower quadrant pain: Secondary | ICD-10-CM | POA: Diagnosis not present

## 2017-03-16 DIAGNOSIS — R110 Nausea: Secondary | ICD-10-CM | POA: Diagnosis not present

## 2017-03-16 DIAGNOSIS — R0602 Shortness of breath: Secondary | ICD-10-CM | POA: Diagnosis not present

## 2017-03-16 DIAGNOSIS — K651 Peritoneal abscess: Secondary | ICD-10-CM | POA: Diagnosis not present

## 2017-03-16 DIAGNOSIS — Z483 Aftercare following surgery for neoplasm: Secondary | ICD-10-CM | POA: Diagnosis not present

## 2017-03-16 DIAGNOSIS — Z79899 Other long term (current) drug therapy: Secondary | ICD-10-CM | POA: Insufficient documentation

## 2017-03-16 DIAGNOSIS — C241 Malignant neoplasm of ampulla of Vater: Secondary | ICD-10-CM | POA: Diagnosis not present

## 2017-03-16 DIAGNOSIS — L02211 Cutaneous abscess of abdominal wall: Secondary | ICD-10-CM | POA: Diagnosis not present

## 2017-03-16 DIAGNOSIS — K3532 Acute appendicitis with perforation and localized peritonitis, without abscess: Secondary | ICD-10-CM | POA: Diagnosis not present

## 2017-03-16 DIAGNOSIS — R109 Unspecified abdominal pain: Secondary | ICD-10-CM | POA: Diagnosis not present

## 2017-03-16 DIAGNOSIS — Z9689 Presence of other specified functional implants: Secondary | ICD-10-CM | POA: Diagnosis not present

## 2017-03-16 DIAGNOSIS — R202 Paresthesia of skin: Secondary | ICD-10-CM | POA: Diagnosis not present

## 2017-03-16 DIAGNOSIS — Z95828 Presence of other vascular implants and grafts: Secondary | ICD-10-CM | POA: Diagnosis not present

## 2017-03-16 DIAGNOSIS — I959 Hypotension, unspecified: Secondary | ICD-10-CM | POA: Diagnosis not present

## 2017-03-16 DIAGNOSIS — R079 Chest pain, unspecified: Secondary | ICD-10-CM | POA: Diagnosis not present

## 2017-03-16 LAB — CBC
HEMATOCRIT: 35.2 % — AB (ref 39.0–52.0)
HEMOGLOBIN: 10.7 g/dL — AB (ref 13.0–17.0)
MCH: 24.2 pg — AB (ref 26.0–34.0)
MCHC: 30.4 g/dL (ref 30.0–36.0)
MCV: 79.5 fL (ref 78.0–100.0)
Platelets: 444 10*3/uL — ABNORMAL HIGH (ref 150–400)
RBC: 4.43 MIL/uL (ref 4.22–5.81)
RDW: 16.1 % — ABNORMAL HIGH (ref 11.5–15.5)
WBC: 6.6 10*3/uL (ref 4.0–10.5)

## 2017-03-16 LAB — URINALYSIS, ROUTINE W REFLEX MICROSCOPIC
BACTERIA UA: NONE SEEN
Bilirubin Urine: NEGATIVE
GLUCOSE, UA: NEGATIVE mg/dL
Hgb urine dipstick: NEGATIVE
KETONES UR: NEGATIVE mg/dL
Leukocytes, UA: NEGATIVE
NITRITE: NEGATIVE
PROTEIN: 100 mg/dL — AB
Specific Gravity, Urine: 1.041 — ABNORMAL HIGH (ref 1.005–1.030)
pH: 6 (ref 5.0–8.0)

## 2017-03-16 LAB — COMPREHENSIVE METABOLIC PANEL
ALT: 24 U/L (ref 17–63)
AST: 45 U/L — AB (ref 15–41)
Albumin: 2.7 g/dL — ABNORMAL LOW (ref 3.5–5.0)
Alkaline Phosphatase: 455 U/L — ABNORMAL HIGH (ref 38–126)
Anion gap: 16 — ABNORMAL HIGH (ref 5–15)
BUN: 22 mg/dL — ABNORMAL HIGH (ref 6–20)
CHLORIDE: 96 mmol/L — AB (ref 101–111)
CO2: 22 mmol/L (ref 22–32)
Calcium: 8.8 mg/dL — ABNORMAL LOW (ref 8.9–10.3)
Creatinine, Ser: 0.84 mg/dL (ref 0.61–1.24)
GFR calc Af Amer: >60 mL/min (ref >60)
Glucose, Bld: 109 mg/dL — ABNORMAL HIGH (ref 65–99)
POTASSIUM: 4.3 mmol/L (ref 3.5–5.1)
Sodium: 134 mmol/L — ABNORMAL LOW (ref 135–145)
Total Bilirubin: 0.8 mg/dL (ref 0.3–1.2)
Total Protein: 7.3 g/dL (ref 6.5–8.1)

## 2017-03-16 LAB — INFLUENZA PANEL BY PCR (TYPE A & B)
Influenza A By PCR: NEGATIVE
Influenza B By PCR: NEGATIVE

## 2017-03-16 LAB — LIPASE, BLOOD: LIPASE: 18 U/L (ref 11–51)

## 2017-03-16 LAB — I-STAT CG4 LACTIC ACID, ED: LACTIC ACID, VENOUS: 1.99 mmol/L — AB (ref 0.5–1.9)

## 2017-03-16 MED ORDER — VANCOMYCIN HCL IN DEXTROSE 1-5 GM/200ML-% IV SOLN
1000.0000 mg | Freq: Once | INTRAVENOUS | Status: AC
  Administered 2017-03-16: 1000 mg via INTRAVENOUS
  Filled 2017-03-16: qty 200

## 2017-03-16 MED ORDER — PIPERACILLIN-TAZOBACTAM 3.375 G IVPB 30 MIN
3.3750 g | Freq: Once | INTRAVENOUS | Status: AC
Start: 2017-03-16 — End: 2017-03-16
  Administered 2017-03-16: 3.375 g via INTRAVENOUS
  Filled 2017-03-16: qty 50

## 2017-03-16 MED ORDER — SODIUM CHLORIDE 0.9 % IV BOLUS (SEPSIS)
1000.0000 mL | Freq: Once | INTRAVENOUS | Status: AC
  Administered 2017-03-16: 1000 mL via INTRAVENOUS

## 2017-03-16 MED ORDER — IOPAMIDOL (ISOVUE-370) INJECTION 76%
100.0000 mL | Freq: Once | INTRAVENOUS | Status: AC | PRN
  Administered 2017-03-16: 100 mL via INTRAVENOUS

## 2017-03-16 MED ORDER — FENTANYL CITRATE (PF) 100 MCG/2ML IJ SOLN
25.0000 ug | Freq: Once | INTRAMUSCULAR | Status: AC
  Administered 2017-03-16: 25 ug via INTRAVENOUS
  Filled 2017-03-16: qty 2

## 2017-03-16 MED ORDER — SODIUM CHLORIDE 0.9 % IV BOLUS (SEPSIS)
1500.0000 mL | Freq: Once | INTRAVENOUS | Status: AC
  Administered 2017-03-16: 1500 mL via INTRAVENOUS

## 2017-03-16 MED ORDER — SODIUM CHLORIDE 0.9 % IV SOLN
Freq: Once | INTRAVENOUS | Status: AC
  Administered 2017-03-16: 14:00:00 via INTRAVENOUS

## 2017-03-16 NOTE — ED Notes (Signed)
Called [facility] for transport to [facility] ER.  Informed," it would be awhile before they had a truck for [place]".  Informed by EDP  To call [facility] Dispatcher for transport is we needed transport.  Dispatcher will call back with an ETA.

## 2017-03-16 NOTE — ED Notes (Signed)
bld cultures drawn

## 2017-03-16 NOTE — ED Notes (Signed)
Manual BP taken due to hypotensive

## 2017-03-16 NOTE — ED Provider Notes (Signed)
[facility] EMERGENCY DEPARTMENT Provider Note   CSN: [id number] Arrival date & time: 03/16/17  1350     History   Chief Complaint Chief Complaint  Patient presents with  . Shortness of Breath    HPI [name] is a 47 y.o. male.  HPI Patient with acute onset fevers and chills, myalgias, cough, congestion starting earlier this morning.  Has son with similar viral symptoms.  Patient also complains of left-sided neck pain for the past 3 days.  He has some numbness and tingling in his left arm.  Denies nausea, vomiting or diarrhea. Past Medical History:  Diagnosis Date  . Ampullary carcinoma (HCC)    pancreatic   . Anxiety   . Aspiration pneumonia ([place])   . Chronic pain   . GERD (gastroesophageal reflux disease)   . History of kidney stones   . Pancreatic fluid leak 08/2016   s/p whipple    Patient Active Problem List   Diagnosis Date Noted  . Aspiration into airway 10/29/2016  . Sepsis ([place]) 10/29/2016  . Ampullary carcinoma ([place]) 10/29/2016  . Chronic pain 10/29/2016  . Protein-calorie malnutrition, severe 10/29/2016  . Dilated bile duct 07/05/2016  . Biliary obstruction 07/05/2016  . Abnormal LFTs 03/21/2016  . Anemia 03/21/2016  . Melena 03/21/2016    Past Surgical History:  Procedure Laterality Date  . BILIARY STENT PLACEMENT N/A 07/06/2016   Procedure: BILIARY STENT PLACEMENT;  Surgeon: Daneil Dolin, MD;  Location: [facility];  Service: Endoscopy;  Laterality: N/A;  . BIOPSY  07/06/2016   Procedure: BIOPSY ampullary;  Surgeon: Daneil Dolin, MD;  Location: [facility];  Service: Endoscopy;;  . ERCP N/A 07/06/2016   Procedure: ENDOSCOPIC RETROGRADE CHOLANGIOPANCREATOGRAPHY (ERCP);  Surgeon: Daneil Dolin, MD;  Location: [facility];  Service: Endoscopy;  Laterality: N/A;  130   . SPHINCTEROTOMY  07/06/2016   Procedure: SPHINCTEROTOMY;  Surgeon: Daneil Dolin, MD;  Location: [facility];  Service: Endoscopy;;  . [place] ARTERY  STENT  08/2016   [facility]  . WHIPPLE PROCEDURE  08/08/2016   Dr. Crisoforo Oxford at [facility] Medications    Prior to Admission medications   Medication Sig Start Date End Date Taking? Authorizing Provider  ALPRAZolam Duanne Moron) 0.25 MG tablet Take 0.25 mg by mouth at bedtime as needed for anxiety.   Yes [provider]  CREON 24000-76000 units CPEP Take 3 capsules by mouth 3 (three) times daily with meals. 10/11/16  Yes [provider]  docusate sodium (COLACE) 100 MG capsule Take 100 mg by mouth 2 (two) times daily as needed for mild constipation.   Yes [provider]  morphine (MS CONTIN) 30 MG 12 hr tablet TAKE ONE TABLET BY MOUTH EVERY 12 HOURS FOR PAIN MANAGEMENT 03/11/17  Yes [provider]  Oxycodone HCl 10 MG TABS Take 10-15 mg by mouth every 4 (four) hours.    Yes [provider]  pantoprazole (PROTONIX) 40 MG tablet Take 40 mg by mouth 2 (two) times daily.   Yes [provider]    Family History Family History  Problem Relation Age of Onset  . Liver disease Neg Hx     Social History Social History   Tobacco Use  . Smoking status: Former Smoker    Packs/day: 1.00    Years: 24.00    Pack years: 24.00    Types: Cigarettes    Last attempt to quit: 07/06/2011  Years since quitting: 5.6  . Smokeless tobacco: Former Systems developer    Types: Snuff    Quit date: 07/06/1982  Substance Use Topics  . Alcohol use: No    Comment: socially , moonshine once every 3 months  . Drug use: Yes    Types: Marijuana     Allergies   Poison oak extract   Review of Systems Review of Systems  Constitutional: Positive for chills, fatigue and fever.  HENT: Positive for congestion. Negative for sore throat and trouble swallowing.   Eyes: Negative for visual disturbance.  Respiratory: Positive for cough and shortness of breath.   Cardiovascular: Negative for chest pain, palpitations and leg swelling.  Gastrointestinal: Negative for  abdominal pain, diarrhea and nausea.  Genitourinary: Negative for flank pain, frequency and hematuria.  Musculoskeletal: Positive for back pain, myalgias and neck pain.  Skin: Negative for rash and wound.  Neurological: Positive for numbness. Negative for dizziness, weakness, light-headedness and headaches.  All other systems reviewed and are negative.    Physical Exam Updated Vital Signs BP (!) 96/58 (BP Location: Left Arm)   Pulse (!) 113   Temp 99.8 F (37.7 C)   Resp 19   Ht 5\' 11"  (1.803 m)   Wt 90.7 kg (200 lb)   SpO2 100%   BMI 27.89 kg/m   Physical Exam  Constitutional: He is oriented to person, place, and time. He appears well-developed and well-nourished.  HENT:  Head: Normocephalic and atraumatic.  Mouth/Throat: Oropharynx is clear and moist. No oropharyngeal exudate.  Eyes: EOM are normal. Pupils are equal, round, and reactive to light.  Neck: Normal range of motion. Neck supple.  Patient has left-sided paracervical tenderness to palpation.  No lymphadenopathy.  No definite meningismus.  Cardiovascular: Regular rhythm.  Tachycardia.  Pulmonary/Chest: Effort normal and breath sounds normal.  Abdominal: Soft. Bowel sounds are normal. There is no tenderness. There is no rebound and no guarding.  Musculoskeletal: Normal range of motion. He exhibits no edema or tenderness.  No lower extremity swelling, asymmetry or tenderness.  Distal pulses are 2+.  Lymphadenopathy:    He has no cervical adenopathy.  Neurological: He is alert and oriented to person, place, and time.  Moves all extremities without focal weakness.  Sensation intact.  Skin: Skin is warm. Capillary refill takes less than 2 seconds. No rash noted. He is diaphoretic. No erythema.  Psychiatric: He has a normal mood and affect. His behavior is normal.  Nursing note and vitals reviewed.    ED Treatments / Results  Labs (all labs ordered are listed, but only abnormal results are displayed) Labs Reviewed   CBC - Abnormal; Notable for the following components:      Result Value   Hemoglobin 10.7 (*)    HCT 35.2 (*)    MCH 24.2 (*)    RDW 16.1 (*)    Platelets 444 (*)    All other components within normal limits  COMPREHENSIVE METABOLIC PANEL - Abnormal; Notable for the following components:   Sodium 134 (*)    Chloride 96 (*)    Glucose, Bld 109 (*)    BUN 22 (*)    Calcium 8.8 (*)    Albumin 2.7 (*)    AST 45 (*)    Alkaline Phosphatase 455 (*)    Anion gap 16 (*)    All other components within normal limits  I-STAT CG4 LACTIC ACID, ED - Abnormal; Notable for the following components:   Lactic Acid, Venous 1.99 (*)  All other components within normal limits  CULTURE, BLOOD (ROUTINE X 2)  CULTURE, BLOOD (ROUTINE X 2)  LIPASE, BLOOD  INFLUENZA PANEL BY PCR (TYPE A & B)  URINALYSIS, ROUTINE W REFLEX MICROSCOPIC  I-STAT CG4 LACTIC ACID, ED    EKG  EKG Interpretation  Date/Time:  Saturday March 16 2017 13:53:26 EST Ventricular Rate:  139 PR Interval:    QRS Duration: 84 QT Interval:  286 QTC Calculation: 435 R Axis:   83 Text Interpretation:  Sinus tachycardia Ventricular premature complex Confirmed by Julianne Rice [id number]) on 03/16/2017 7:00:21 PM       Radiology Ct Angio Chest Pe W And/or Wo Contrast  Result Date: 03/16/2017 CLINICAL DATA:  Pt co sob and cp starting today, also co abd pain but states his abd always hurts. History of ampullary carcinoma of the pancreas. History of kidney stones. EXAM: CT ANGIOGRAPHY CHEST CT ABDOMEN AND PELVIS WITH CONTRAST TECHNIQUE: Multidetector CT imaging of the chest was performed using the standard protocol during bolus administration of intravenous contrast. Multiplanar CT image reconstructions and MIPs were obtained to evaluate the vascular anatomy. Multidetector CT imaging of the abdomen and pelvis was performed using the standard protocol during bolus administration of intravenous contrast. CONTRAST:  122mL ISOVUE-370  IOPAMIDOL (ISOVUE-370) INJECTION 76% COMPARISON:  12/08/2016 FINDINGS: CTA CHEST FINDINGS Cardiovascular: Satisfactory opacification of the pulmonary arteries to the segmental level. No evidence of pulmonary embolism. Normal heart size. No pericardial effusion. Mediastinum/Nodes: The visualized portion of the thyroid gland has a normal appearance. The esophagus is normal in appearance. No mediastinal, hilar, or axillary adenopathy. Lungs/Pleura: There is subsegmental atelectasis at the lung bases. Small calcified granuloma is identified at the posterior left upper lobe, image 27 of series 7. There are no focal consolidations pleural effusions. No pulmonary edema. Airways are patent. Musculoskeletal: No chest wall abnormality. No acute or significant osseous findings. Review of the MIP images confirms the above findings. CT ABDOMEN and PELVIS FINDINGS Request is study was CT angio of the abdomen and pelvis. However, study is not optimized for evaluation of the arterial phase. Hepatobiliary: The liver is enlarged and diffusely heterogeneous with geographic areas lower attenuation, favoring perfusion abnormalities. There are focal low-attenuation lesions too small to characterize. In the right hepatic lobe, a 5 millimeter lesion is identified on image 29 of series 13. Pancreas: Status post Whipple procedure. The pancreatic body and tail have a normal appearance. There is persistent significant soft tissue density within the retroperitoneum, at the level of the superior mesenteric artery. Superior mesenteric artery stent is in place and is occluded. There are numerous enlarged lymph nodes in the retroperitoneum at the level of the SMA. Spleen: Stable appearance of low-attenuation lesion within the spleen. Adrenals/Urinary Tract: Adrenal glands are normal in appearance. Kidneys and ureters are unremarkable in appearance. Urinary bladder is normal in appearance. Stomach/Bowel: Status post Whipple procedure. There is  significant soft tissue density around the jejunal loops in the region of the porta hepatis. This density may be related to inflammatory changes (see below) or tumor. Within the right lower quadrant, there is an abscess measuring 6.6 x 12 centimeters. Within this collection, there are 2 small dense foci, compatible with appendicoliths. Tubular structure within this abscess is also favored to represent the ruptured appendix. The ascending colon is remarkable for significantly thickened wall. There is pericolonic stranding involving the ascending colon and hepatic flexure. The stranding is contiguous with the mass in the retroperitoneal region, limiting differentiation of inflammatory change from tumor. Within  the left mid abdomen there is a rim enhancing collection measuring 4.9 x 7.1 x 5.6 centimeters, consistent with abscess adjacent to small bowel loops. * Appendix: Location: Retrocecal *Diameter: Ruptured *Appendicolith: Present *Mucosal hyper-enhancement: Yes *Extraluminal gas: Yes *Periappendiceal collection: Yes Vascular/Lymphatic: The superior mesenteric artery stent is now occluded, both proximal and distal to the stent. Flow is reconstituted just beyond the stent. The celiac axis appears well opacified. Retroperitoneal and mesenteric lymphadenopathy. Reproductive: Prostate is unremarkable. Other: Postoperative changes in the anterior abdominal wall. No free pelvic fluid. Musculoskeletal: No acute or significant osseous findings. Review of the MIP images confirms the above findings. IMPRESSION: 1. Technically adequate exam showing no acute pulmonary embolus. 2. Diffuse enlargement of the liver with geographic areas of probable perfusion abnormalities. 3. Indeterminate low-attenuation lesion within the right hepatic lobe, measuring 5 millimeters. 4. Abscess within the right lower quadrant from ruptured appendix. 5. Left upper quadrant abscess. 6. 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 There is reconstitution of flow distal to the stent likely from collaterals. 7. Retroperitoneal and mesenteric adenopathy. These results were called by telephone at the time of interpretation on 03/16/2017 at 5:47 pm to Dr. Julianne Rice , who verbally acknowledged these results. Electronically Signed   By: Nolon Nations M.D.   On: 03/16/2017 17:50   Ct Abdomen Pelvis W Contrast  Result Date: 03/16/2017 : See dictation of CTA chest and CT A/P same day. Electronically Signed   By: Nolon Nations M.D.   On: 03/16/2017 18:32   Dg Chest Port 1 View  Result Date: 03/16/2017 CLINICAL DATA:  Cough since 12 noon today. EXAM: PORTABLE CHEST 1 VIEW COMPARISON:  12/08/2016. FINDINGS: Normal sized heart. Poor inspiration. Minimal bibasilar atelectasis. Increased prominence of the pulmonary vasculature. No airspace consolidation. Unremarkable bones. IMPRESSION: Poor inspiration with interval minimal bibasilar atelectasis and vascular crowding. Electronically Signed   By: Claudie Revering M.D.   On: 03/16/2017 14:34    Procedures Procedures (including critical care time)  Medications Ordered in ED Medications  0.9 %  sodium chloride infusion ( Intravenous Stopped 03/16/17 1606)  sodium chloride 0.9 % bolus 1,000 mL (0 mLs Intravenous Stopped 03/16/17 1606)  vancomycin (VANCOCIN) IVPB 1000 mg/200 mL premix (0 mg Intravenous Stopped 03/16/17 1732)  piperacillin-tazobactam (ZOSYN) IVPB 3.375 g (0 g Intravenous Stopped 03/16/17 1645)  sodium chloride 0.9 % bolus 1,500 mL (0 mLs Intravenous Stopped 03/16/17 1836)  iopamidol (ISOVUE-370) 76 % injection 100 mL (100 mLs Intravenous Contrast Given 03/16/17 1613)   CRITICAL CARE Performed by: Julianne Rice Total critical care time: 50 minutes Critical care time was exclusive of separately billable procedures and treating other patients. Critical care was necessary to treat or prevent imminent or life-threatening deterioration. Critical  care was time spent personally by me on the following activities: development of treatment plan with patient and/or surrogate as well as nursing, discussions with consultants, evaluation of patient's response to treatment, examination of patient, obtaining history from patient or surrogate, ordering and performing treatments and interventions, ordering and review of laboratory studies, ordering and review of radiographic studies, pulse oximetry and re-evaluation of patient's condition.  Initial Impression / Assessment and Plan / ED Course  I have reviewed the triage vital signs and the nursing notes.  Pertinent labs & imaging results that were available during my care of the patient were reviewed by me and considered in my medical decision making (see chart for details).     Discussed patient with Dr. Constance Haw.  Multiple abdominal abscesses likely related to prior Whipple and SMA complications.  Recommends speaking with surgery at [facility].  Dr. Hassell Done will see patient once patient is transferred to the emergency department.  Dr. Noland Fordyce accept patient in transfer. Patient received 30 cc/kg of IV fluids.  Blood pressure is improved to systolic in the 39Y.  Heart rate initially in the 140s now in 110.  Has been given broad-spectrum antibiotics  Final Clinical Impressions(s) / ED Diagnoses   Final diagnoses:  Intra-abdominal abscess [facility])    ED Discharge Orders    None       Julianne Rice, MD 03/16/17 1901

## 2017-03-16 NOTE — ED Notes (Signed)
Per spouse seen by oncologist on Thurs  WBC elevated  Pt has not been feeling well for several days

## 2017-03-16 NOTE — ED Notes (Signed)
Report to Winslow West, RN [facility] transport

## 2017-03-16 NOTE — ED Notes (Signed)
Call t5o CT re: length of time for CT read- Per CT tech, there is an order correction that needs to take place on the rad end, and they are trying to facilitate correction that pt CT will be resulted

## 2017-03-16 NOTE — ED Triage Notes (Signed)
Complaining of pain  Called EMS re SOB  Cancer surgery 6/18

## 2017-03-16 NOTE — ED Notes (Signed)
Urinal provided for pt with request to void when he can

## 2017-03-17 DIAGNOSIS — C241 Malignant neoplasm of ampulla of Vater: Secondary | ICD-10-CM | POA: Diagnosis not present

## 2017-03-17 DIAGNOSIS — K3532 Acute appendicitis with perforation and localized peritonitis, without abscess: Secondary | ICD-10-CM | POA: Diagnosis not present

## 2017-03-17 DIAGNOSIS — I10 Essential (primary) hypertension: Secondary | ICD-10-CM | POA: Diagnosis not present

## 2017-03-17 DIAGNOSIS — R188 Other ascites: Secondary | ICD-10-CM | POA: Diagnosis not present

## 2017-03-17 DIAGNOSIS — Z483 Aftercare following surgery for neoplasm: Secondary | ICD-10-CM | POA: Diagnosis not present

## 2017-03-18 DIAGNOSIS — Z9689 Presence of other specified functional implants: Secondary | ICD-10-CM | POA: Diagnosis not present

## 2017-03-18 DIAGNOSIS — K651 Peritoneal abscess: Secondary | ICD-10-CM | POA: Diagnosis not present

## 2017-03-18 DIAGNOSIS — K3532 Acute appendicitis with perforation and localized peritonitis, without abscess: Secondary | ICD-10-CM | POA: Diagnosis not present

## 2017-03-18 DIAGNOSIS — C241 Malignant neoplasm of ampulla of Vater: Secondary | ICD-10-CM | POA: Diagnosis not present

## 2017-03-18 DIAGNOSIS — Z483 Aftercare following surgery for neoplasm: Secondary | ICD-10-CM | POA: Diagnosis not present

## 2017-03-19 DIAGNOSIS — K3532 Acute appendicitis with perforation and localized peritonitis, without abscess: Secondary | ICD-10-CM | POA: Diagnosis not present

## 2017-03-19 DIAGNOSIS — I10 Essential (primary) hypertension: Secondary | ICD-10-CM | POA: Diagnosis not present

## 2017-03-19 DIAGNOSIS — Z95828 Presence of other vascular implants and grafts: Secondary | ICD-10-CM | POA: Diagnosis not present

## 2017-03-19 DIAGNOSIS — Z483 Aftercare following surgery for neoplasm: Secondary | ICD-10-CM | POA: Diagnosis not present

## 2017-03-21 LAB — CULTURE, BLOOD (ROUTINE X 2)
Culture: NO GROWTH
Culture: NO GROWTH
Special Requests: ADEQUATE
Special Requests: ADEQUATE

## 2017-03-22 ENCOUNTER — Telehealth: Payer: Self-pay | Admitting: *Deleted

## 2017-03-27 ENCOUNTER — Other Ambulatory Visit: Payer: Self-pay

## 2017-03-27 ENCOUNTER — Encounter (HOSPITAL_COMMUNITY): Payer: Self-pay

## 2017-03-27 ENCOUNTER — Emergency Department (HOSPITAL_COMMUNITY)
Admission: EM | Admit: 2017-03-27 | Discharge: 2017-03-27 | Discharge status: Against Medical Advice | Payer: BLUE CROSS/BLUE SHIELD | Attending: Emergency Medicine | Admitting: Emergency Medicine

## 2017-03-27 DIAGNOSIS — Y828 Other medical devices associated with adverse incidents: Secondary | ICD-10-CM | POA: Diagnosis not present

## 2017-03-27 DIAGNOSIS — Z5321 Procedure and treatment not carried out due to patient leaving prior to being seen by health care provider: Secondary | ICD-10-CM | POA: Diagnosis not present

## 2017-03-27 DIAGNOSIS — T8189XA Other complications of procedures, not elsewhere classified, initial encounter: Secondary | ICD-10-CM | POA: Insufficient documentation

## 2017-03-27 NOTE — ED Triage Notes (Signed)
Patient has drain in abdomen d/t appendix needing surgery but unable to d/t high risk. Patient states he flushed drain this morning and now ha blood in tubing. Patient has to flush drain twice a day. Denies fever.

## 2017-03-27 NOTE — ED Notes (Signed)
Per registration pt left ED at 1644.

## 2017-04-01 DIAGNOSIS — Z4682 Encounter for fitting and adjustment of non-vascular catheter: Secondary | ICD-10-CM | POA: Diagnosis not present

## 2017-04-30 DIAGNOSIS — C241 Malignant neoplasm of ampulla of Vater: Secondary | ICD-10-CM | POA: Diagnosis not present

## 2017-06-03 DIAGNOSIS — D509 Iron deficiency anemia, unspecified: Secondary | ICD-10-CM | POA: Diagnosis not present

## 2017-06-03 DIAGNOSIS — R5383 Other fatigue: Secondary | ICD-10-CM | POA: Diagnosis not present

## 2017-06-03 DIAGNOSIS — Z9049 Acquired absence of other specified parts of digestive tract: Secondary | ICD-10-CM | POA: Diagnosis not present

## 2017-06-03 DIAGNOSIS — G893 Neoplasm related pain (acute) (chronic): Secondary | ICD-10-CM | POA: Diagnosis not present

## 2017-06-03 DIAGNOSIS — K769 Liver disease, unspecified: Secondary | ICD-10-CM | POA: Diagnosis not present

## 2017-06-03 DIAGNOSIS — F419 Anxiety disorder, unspecified: Secondary | ICD-10-CM | POA: Diagnosis not present

## 2017-06-03 DIAGNOSIS — D374 Neoplasm of uncertain behavior of colon: Secondary | ICD-10-CM | POA: Diagnosis not present

## 2017-06-03 DIAGNOSIS — R110 Nausea: Secondary | ICD-10-CM | POA: Diagnosis not present

## 2017-06-03 DIAGNOSIS — R590 Localized enlarged lymph nodes: Secondary | ICD-10-CM | POA: Diagnosis not present

## 2017-06-03 DIAGNOSIS — C241 Malignant neoplasm of ampulla of Vater: Secondary | ICD-10-CM | POA: Diagnosis not present

## 2017-06-03 DIAGNOSIS — K635 Polyp of colon: Secondary | ICD-10-CM | POA: Diagnosis not present

## 2017-06-03 DIAGNOSIS — Z87891 Personal history of nicotine dependence: Secondary | ICD-10-CM | POA: Diagnosis not present

## 2017-06-03 DIAGNOSIS — D120 Benign neoplasm of cecum: Secondary | ICD-10-CM | POA: Diagnosis not present

## 2017-06-03 DIAGNOSIS — M549 Dorsalgia, unspecified: Secondary | ICD-10-CM | POA: Diagnosis not present

## 2017-06-03 DIAGNOSIS — K760 Fatty (change of) liver, not elsewhere classified: Secondary | ICD-10-CM | POA: Diagnosis not present

## 2017-06-03 DIAGNOSIS — C787 Secondary malignant neoplasm of liver and intrahepatic bile duct: Secondary | ICD-10-CM | POA: Diagnosis not present

## 2017-06-03 DIAGNOSIS — R109 Unspecified abdominal pain: Secondary | ICD-10-CM | POA: Diagnosis not present

## 2017-06-03 DIAGNOSIS — N281 Cyst of kidney, acquired: Secondary | ICD-10-CM | POA: Diagnosis not present

## 2017-06-03 DIAGNOSIS — Z86010 Personal history of colonic polyps: Secondary | ICD-10-CM | POA: Diagnosis not present

## 2017-06-04 DIAGNOSIS — D500 Iron deficiency anemia secondary to blood loss (chronic): Secondary | ICD-10-CM | POA: Diagnosis not present

## 2017-06-04 DIAGNOSIS — Z86010 Personal history of colonic polyps: Secondary | ICD-10-CM | POA: Diagnosis not present

## 2017-06-04 DIAGNOSIS — R109 Unspecified abdominal pain: Secondary | ICD-10-CM | POA: Diagnosis not present

## 2017-06-04 DIAGNOSIS — J439 Emphysema, unspecified: Secondary | ICD-10-CM | POA: Diagnosis not present

## 2017-06-04 DIAGNOSIS — M25511 Pain in right shoulder: Secondary | ICD-10-CM | POA: Diagnosis not present

## 2017-06-04 DIAGNOSIS — M549 Dorsalgia, unspecified: Secondary | ICD-10-CM | POA: Diagnosis not present

## 2017-06-04 DIAGNOSIS — M545 Low back pain: Secondary | ICD-10-CM | POA: Diagnosis not present

## 2017-06-04 DIAGNOSIS — C241 Malignant neoplasm of ampulla of Vater: Secondary | ICD-10-CM | POA: Diagnosis not present

## 2017-06-05 DIAGNOSIS — M549 Dorsalgia, unspecified: Secondary | ICD-10-CM | POA: Diagnosis not present

## 2017-06-05 DIAGNOSIS — R109 Unspecified abdominal pain: Secondary | ICD-10-CM | POA: Diagnosis not present

## 2017-06-05 DIAGNOSIS — K7689 Other specified diseases of liver: Secondary | ICD-10-CM | POA: Diagnosis not present

## 2017-06-05 DIAGNOSIS — M25511 Pain in right shoulder: Secondary | ICD-10-CM | POA: Diagnosis not present

## 2017-06-05 DIAGNOSIS — G893 Neoplasm related pain (acute) (chronic): Secondary | ICD-10-CM | POA: Diagnosis not present

## 2017-06-05 DIAGNOSIS — D374 Neoplasm of uncertain behavior of colon: Secondary | ICD-10-CM | POA: Diagnosis not present

## 2017-06-05 DIAGNOSIS — C787 Secondary malignant neoplasm of liver and intrahepatic bile duct: Secondary | ICD-10-CM | POA: Diagnosis not present

## 2017-06-05 DIAGNOSIS — F419 Anxiety disorder, unspecified: Secondary | ICD-10-CM | POA: Diagnosis not present

## 2017-06-05 DIAGNOSIS — Z87891 Personal history of nicotine dependence: Secondary | ICD-10-CM | POA: Diagnosis not present

## 2017-06-05 DIAGNOSIS — R5383 Other fatigue: Secondary | ICD-10-CM | POA: Diagnosis not present

## 2017-06-05 DIAGNOSIS — C241 Malignant neoplasm of ampulla of Vater: Secondary | ICD-10-CM | POA: Diagnosis not present

## 2017-06-05 DIAGNOSIS — Z86010 Personal history of colonic polyps: Secondary | ICD-10-CM | POA: Diagnosis not present

## 2017-06-05 DIAGNOSIS — K760 Fatty (change of) liver, not elsewhere classified: Secondary | ICD-10-CM | POA: Diagnosis not present

## 2017-06-05 DIAGNOSIS — R110 Nausea: Secondary | ICD-10-CM | POA: Diagnosis not present

## 2017-06-05 DIAGNOSIS — K635 Polyp of colon: Secondary | ICD-10-CM | POA: Diagnosis not present

## 2017-06-05 DIAGNOSIS — D509 Iron deficiency anemia, unspecified: Secondary | ICD-10-CM | POA: Diagnosis not present

## 2017-06-05 DIAGNOSIS — R590 Localized enlarged lymph nodes: Secondary | ICD-10-CM | POA: Diagnosis not present

## 2017-06-05 DIAGNOSIS — M19011 Primary osteoarthritis, right shoulder: Secondary | ICD-10-CM | POA: Diagnosis not present

## 2017-06-10 DIAGNOSIS — C241 Malignant neoplasm of ampulla of Vater: Secondary | ICD-10-CM | POA: Diagnosis not present

## 2017-06-10 DIAGNOSIS — C787 Secondary malignant neoplasm of liver and intrahepatic bile duct: Secondary | ICD-10-CM | POA: Diagnosis not present

## 2017-06-12 DIAGNOSIS — R109 Unspecified abdominal pain: Secondary | ICD-10-CM | POA: Diagnosis not present

## 2017-06-12 DIAGNOSIS — Z86010 Personal history of colonic polyps: Secondary | ICD-10-CM | POA: Diagnosis not present

## 2017-06-12 DIAGNOSIS — F419 Anxiety disorder, unspecified: Secondary | ICD-10-CM | POA: Diagnosis not present

## 2017-06-12 DIAGNOSIS — G893 Neoplasm related pain (acute) (chronic): Secondary | ICD-10-CM | POA: Diagnosis not present

## 2017-06-12 DIAGNOSIS — J439 Emphysema, unspecified: Secondary | ICD-10-CM | POA: Diagnosis not present

## 2017-06-12 DIAGNOSIS — Z87891 Personal history of nicotine dependence: Secondary | ICD-10-CM | POA: Diagnosis not present

## 2017-06-12 DIAGNOSIS — D374 Neoplasm of uncertain behavior of colon: Secondary | ICD-10-CM | POA: Diagnosis not present

## 2017-06-12 DIAGNOSIS — C787 Secondary malignant neoplasm of liver and intrahepatic bile duct: Secondary | ICD-10-CM | POA: Diagnosis not present

## 2017-06-12 DIAGNOSIS — R110 Nausea: Secondary | ICD-10-CM | POA: Diagnosis not present

## 2017-06-12 DIAGNOSIS — C241 Malignant neoplasm of ampulla of Vater: Secondary | ICD-10-CM | POA: Diagnosis not present

## 2017-06-12 DIAGNOSIS — D509 Iron deficiency anemia, unspecified: Secondary | ICD-10-CM | POA: Diagnosis not present

## 2017-06-12 DIAGNOSIS — M549 Dorsalgia, unspecified: Secondary | ICD-10-CM | POA: Diagnosis not present

## 2017-06-12 DIAGNOSIS — K635 Polyp of colon: Secondary | ICD-10-CM | POA: Diagnosis not present

## 2017-06-12 DIAGNOSIS — R5383 Other fatigue: Secondary | ICD-10-CM | POA: Diagnosis not present

## 2017-06-12 DIAGNOSIS — Z452 Encounter for adjustment and management of vascular access device: Secondary | ICD-10-CM | POA: Diagnosis not present

## 2017-06-13 DIAGNOSIS — M549 Dorsalgia, unspecified: Secondary | ICD-10-CM | POA: Diagnosis not present

## 2017-06-13 DIAGNOSIS — Z86010 Personal history of colonic polyps: Secondary | ICD-10-CM | POA: Diagnosis not present

## 2017-06-13 DIAGNOSIS — D490 Neoplasm of unspecified behavior of digestive system: Secondary | ICD-10-CM | POA: Diagnosis not present

## 2017-06-13 DIAGNOSIS — G893 Neoplasm related pain (acute) (chronic): Secondary | ICD-10-CM | POA: Diagnosis not present

## 2017-06-13 DIAGNOSIS — R110 Nausea: Secondary | ICD-10-CM | POA: Diagnosis not present

## 2017-06-13 DIAGNOSIS — D120 Benign neoplasm of cecum: Secondary | ICD-10-CM | POA: Diagnosis not present

## 2017-06-13 DIAGNOSIS — C787 Secondary malignant neoplasm of liver and intrahepatic bile duct: Secondary | ICD-10-CM | POA: Diagnosis not present

## 2017-06-13 DIAGNOSIS — D374 Neoplasm of uncertain behavior of colon: Secondary | ICD-10-CM | POA: Diagnosis not present

## 2017-06-13 DIAGNOSIS — F419 Anxiety disorder, unspecified: Secondary | ICD-10-CM | POA: Diagnosis not present

## 2017-06-13 DIAGNOSIS — R5383 Other fatigue: Secondary | ICD-10-CM | POA: Diagnosis not present

## 2017-06-13 DIAGNOSIS — C241 Malignant neoplasm of ampulla of Vater: Secondary | ICD-10-CM | POA: Diagnosis not present

## 2017-06-13 DIAGNOSIS — D509 Iron deficiency anemia, unspecified: Secondary | ICD-10-CM | POA: Diagnosis not present

## 2017-06-13 DIAGNOSIS — R109 Unspecified abdominal pain: Secondary | ICD-10-CM | POA: Diagnosis not present

## 2017-06-13 DIAGNOSIS — Z87891 Personal history of nicotine dependence: Secondary | ICD-10-CM | POA: Diagnosis not present

## 2017-06-13 DIAGNOSIS — K635 Polyp of colon: Secondary | ICD-10-CM | POA: Diagnosis not present

## 2017-06-14 DIAGNOSIS — R110 Nausea: Secondary | ICD-10-CM | POA: Diagnosis not present

## 2017-06-14 DIAGNOSIS — Z86010 Personal history of colonic polyps: Secondary | ICD-10-CM | POA: Diagnosis not present

## 2017-06-14 DIAGNOSIS — K635 Polyp of colon: Secondary | ICD-10-CM | POA: Diagnosis not present

## 2017-06-14 DIAGNOSIS — R109 Unspecified abdominal pain: Secondary | ICD-10-CM | POA: Diagnosis not present

## 2017-06-14 DIAGNOSIS — F419 Anxiety disorder, unspecified: Secondary | ICD-10-CM | POA: Diagnosis not present

## 2017-06-14 DIAGNOSIS — R5383 Other fatigue: Secondary | ICD-10-CM | POA: Diagnosis not present

## 2017-06-14 DIAGNOSIS — D509 Iron deficiency anemia, unspecified: Secondary | ICD-10-CM | POA: Diagnosis not present

## 2017-06-14 DIAGNOSIS — D374 Neoplasm of uncertain behavior of colon: Secondary | ICD-10-CM | POA: Diagnosis not present

## 2017-06-14 DIAGNOSIS — Z87891 Personal history of nicotine dependence: Secondary | ICD-10-CM | POA: Diagnosis not present

## 2017-06-14 DIAGNOSIS — M549 Dorsalgia, unspecified: Secondary | ICD-10-CM | POA: Diagnosis not present

## 2017-06-14 DIAGNOSIS — G893 Neoplasm related pain (acute) (chronic): Secondary | ICD-10-CM | POA: Diagnosis not present

## 2017-06-14 DIAGNOSIS — C241 Malignant neoplasm of ampulla of Vater: Secondary | ICD-10-CM | POA: Diagnosis not present

## 2017-06-14 DIAGNOSIS — C787 Secondary malignant neoplasm of liver and intrahepatic bile duct: Secondary | ICD-10-CM | POA: Diagnosis not present

## 2017-06-20 DIAGNOSIS — D490 Neoplasm of unspecified behavior of digestive system: Secondary | ICD-10-CM | POA: Diagnosis not present

## 2017-06-20 DIAGNOSIS — C241 Malignant neoplasm of ampulla of Vater: Secondary | ICD-10-CM | POA: Diagnosis not present

## 2017-06-21 DIAGNOSIS — R1084 Generalized abdominal pain: Secondary | ICD-10-CM | POA: Diagnosis not present

## 2017-06-26 DIAGNOSIS — Z79899 Other long term (current) drug therapy: Secondary | ICD-10-CM | POA: Diagnosis not present

## 2017-06-26 DIAGNOSIS — Z87891 Personal history of nicotine dependence: Secondary | ICD-10-CM | POA: Diagnosis not present

## 2017-06-26 DIAGNOSIS — D509 Iron deficiency anemia, unspecified: Secondary | ICD-10-CM | POA: Diagnosis not present

## 2017-06-26 DIAGNOSIS — Z79891 Long term (current) use of opiate analgesic: Secondary | ICD-10-CM | POA: Diagnosis not present

## 2017-06-26 DIAGNOSIS — R109 Unspecified abdominal pain: Secondary | ICD-10-CM | POA: Diagnosis not present

## 2017-06-26 DIAGNOSIS — Z452 Encounter for adjustment and management of vascular access device: Secondary | ICD-10-CM | POA: Diagnosis not present

## 2017-06-26 DIAGNOSIS — Z8509 Personal history of malignant neoplasm of other digestive organs: Secondary | ICD-10-CM | POA: Diagnosis not present

## 2017-06-26 DIAGNOSIS — K219 Gastro-esophageal reflux disease without esophagitis: Secondary | ICD-10-CM | POA: Diagnosis not present

## 2017-06-26 DIAGNOSIS — D490 Neoplasm of unspecified behavior of digestive system: Secondary | ICD-10-CM | POA: Diagnosis not present

## 2017-06-26 DIAGNOSIS — J439 Emphysema, unspecified: Secondary | ICD-10-CM | POA: Diagnosis not present

## 2017-06-26 DIAGNOSIS — C787 Secondary malignant neoplasm of liver and intrahepatic bile duct: Secondary | ICD-10-CM | POA: Diagnosis not present

## 2017-06-26 DIAGNOSIS — C241 Malignant neoplasm of ampulla of Vater: Secondary | ICD-10-CM | POA: Diagnosis not present

## 2017-06-26 DIAGNOSIS — I998 Other disorder of circulatory system: Secondary | ICD-10-CM | POA: Diagnosis not present

## 2017-06-26 DIAGNOSIS — M549 Dorsalgia, unspecified: Secondary | ICD-10-CM | POA: Diagnosis not present

## 2017-06-26 DIAGNOSIS — G893 Neoplasm related pain (acute) (chronic): Secondary | ICD-10-CM | POA: Diagnosis not present

## 2017-06-27 DIAGNOSIS — Z79899 Other long term (current) drug therapy: Secondary | ICD-10-CM | POA: Diagnosis not present

## 2017-06-27 DIAGNOSIS — G893 Neoplasm related pain (acute) (chronic): Secondary | ICD-10-CM | POA: Diagnosis not present

## 2017-06-27 DIAGNOSIS — Z79891 Long term (current) use of opiate analgesic: Secondary | ICD-10-CM | POA: Diagnosis not present

## 2017-06-27 DIAGNOSIS — C241 Malignant neoplasm of ampulla of Vater: Secondary | ICD-10-CM | POA: Diagnosis not present

## 2017-06-27 DIAGNOSIS — D509 Iron deficiency anemia, unspecified: Secondary | ICD-10-CM | POA: Diagnosis not present

## 2017-06-27 DIAGNOSIS — M549 Dorsalgia, unspecified: Secondary | ICD-10-CM | POA: Diagnosis not present

## 2017-06-27 DIAGNOSIS — Z87891 Personal history of nicotine dependence: Secondary | ICD-10-CM | POA: Diagnosis not present

## 2017-06-27 DIAGNOSIS — K219 Gastro-esophageal reflux disease without esophagitis: Secondary | ICD-10-CM | POA: Diagnosis not present

## 2017-06-27 DIAGNOSIS — C787 Secondary malignant neoplasm of liver and intrahepatic bile duct: Secondary | ICD-10-CM | POA: Diagnosis not present

## 2017-06-27 DIAGNOSIS — D490 Neoplasm of unspecified behavior of digestive system: Secondary | ICD-10-CM | POA: Diagnosis not present

## 2017-06-27 DIAGNOSIS — J439 Emphysema, unspecified: Secondary | ICD-10-CM | POA: Diagnosis not present

## 2017-06-27 DIAGNOSIS — R109 Unspecified abdominal pain: Secondary | ICD-10-CM | POA: Diagnosis not present

## 2017-06-29 DIAGNOSIS — G893 Neoplasm related pain (acute) (chronic): Secondary | ICD-10-CM | POA: Diagnosis not present

## 2017-06-29 DIAGNOSIS — Z79891 Long term (current) use of opiate analgesic: Secondary | ICD-10-CM | POA: Diagnosis not present

## 2017-06-29 DIAGNOSIS — K219 Gastro-esophageal reflux disease without esophagitis: Secondary | ICD-10-CM | POA: Diagnosis not present

## 2017-06-29 DIAGNOSIS — M549 Dorsalgia, unspecified: Secondary | ICD-10-CM | POA: Diagnosis not present

## 2017-06-29 DIAGNOSIS — C241 Malignant neoplasm of ampulla of Vater: Secondary | ICD-10-CM | POA: Diagnosis not present

## 2017-06-29 DIAGNOSIS — C787 Secondary malignant neoplasm of liver and intrahepatic bile duct: Secondary | ICD-10-CM | POA: Diagnosis not present

## 2017-06-29 DIAGNOSIS — D509 Iron deficiency anemia, unspecified: Secondary | ICD-10-CM | POA: Diagnosis not present

## 2017-06-29 DIAGNOSIS — R109 Unspecified abdominal pain: Secondary | ICD-10-CM | POA: Diagnosis not present

## 2017-06-29 DIAGNOSIS — J439 Emphysema, unspecified: Secondary | ICD-10-CM | POA: Diagnosis not present

## 2017-06-29 DIAGNOSIS — G8929 Other chronic pain: Secondary | ICD-10-CM | POA: Diagnosis not present

## 2017-06-29 DIAGNOSIS — Z79899 Other long term (current) drug therapy: Secondary | ICD-10-CM | POA: Diagnosis not present

## 2017-06-29 DIAGNOSIS — Z87891 Personal history of nicotine dependence: Secondary | ICD-10-CM | POA: Diagnosis not present

## 2017-07-01 DIAGNOSIS — Z79899 Other long term (current) drug therapy: Secondary | ICD-10-CM | POA: Diagnosis not present

## 2017-07-01 DIAGNOSIS — S30861A Insect bite (nonvenomous) of abdominal wall, initial encounter: Secondary | ICD-10-CM | POA: Diagnosis not present

## 2017-07-01 DIAGNOSIS — C228 Malignant neoplasm of liver, primary, unspecified as to type: Secondary | ICD-10-CM | POA: Diagnosis not present

## 2017-07-01 DIAGNOSIS — W57XXXA Bitten or stung by nonvenomous insect and other nonvenomous arthropods, initial encounter: Secondary | ICD-10-CM | POA: Diagnosis not present

## 2017-07-09 DIAGNOSIS — C241 Malignant neoplasm of ampulla of Vater: Secondary | ICD-10-CM | POA: Diagnosis not present

## 2017-07-14 DIAGNOSIS — D490 Neoplasm of unspecified behavior of digestive system: Secondary | ICD-10-CM | POA: Diagnosis not present

## 2017-07-16 DIAGNOSIS — C787 Secondary malignant neoplasm of liver and intrahepatic bile duct: Secondary | ICD-10-CM | POA: Diagnosis not present

## 2017-07-16 DIAGNOSIS — E611 Iron deficiency: Secondary | ICD-10-CM | POA: Diagnosis not present

## 2017-07-16 DIAGNOSIS — C241 Malignant neoplasm of ampulla of Vater: Secondary | ICD-10-CM | POA: Diagnosis not present

## 2017-07-16 DIAGNOSIS — Z5111 Encounter for antineoplastic chemotherapy: Secondary | ICD-10-CM | POA: Diagnosis not present

## 2017-07-16 DIAGNOSIS — J439 Emphysema, unspecified: Secondary | ICD-10-CM | POA: Diagnosis not present

## 2017-07-16 DIAGNOSIS — M549 Dorsalgia, unspecified: Secondary | ICD-10-CM | POA: Diagnosis not present

## 2017-07-16 DIAGNOSIS — Z79899 Other long term (current) drug therapy: Secondary | ICD-10-CM | POA: Diagnosis not present

## 2017-07-16 DIAGNOSIS — D509 Iron deficiency anemia, unspecified: Secondary | ICD-10-CM | POA: Diagnosis not present

## 2017-07-17 DIAGNOSIS — D509 Iron deficiency anemia, unspecified: Secondary | ICD-10-CM | POA: Diagnosis not present

## 2017-07-17 DIAGNOSIS — C787 Secondary malignant neoplasm of liver and intrahepatic bile duct: Secondary | ICD-10-CM | POA: Diagnosis not present

## 2017-07-17 DIAGNOSIS — Z79899 Other long term (current) drug therapy: Secondary | ICD-10-CM | POA: Diagnosis not present

## 2017-07-17 DIAGNOSIS — J439 Emphysema, unspecified: Secondary | ICD-10-CM | POA: Diagnosis not present

## 2017-07-17 DIAGNOSIS — Z5111 Encounter for antineoplastic chemotherapy: Secondary | ICD-10-CM | POA: Diagnosis not present

## 2017-07-17 DIAGNOSIS — D490 Neoplasm of unspecified behavior of digestive system: Secondary | ICD-10-CM | POA: Diagnosis not present

## 2017-07-17 DIAGNOSIS — C241 Malignant neoplasm of ampulla of Vater: Secondary | ICD-10-CM | POA: Diagnosis not present

## 2017-07-17 DIAGNOSIS — M549 Dorsalgia, unspecified: Secondary | ICD-10-CM | POA: Diagnosis not present

## 2017-07-19 DIAGNOSIS — D509 Iron deficiency anemia, unspecified: Secondary | ICD-10-CM | POA: Diagnosis not present

## 2017-07-19 DIAGNOSIS — C241 Malignant neoplasm of ampulla of Vater: Secondary | ICD-10-CM | POA: Diagnosis not present

## 2017-07-19 DIAGNOSIS — J439 Emphysema, unspecified: Secondary | ICD-10-CM | POA: Diagnosis not present

## 2017-07-19 DIAGNOSIS — Z79899 Other long term (current) drug therapy: Secondary | ICD-10-CM | POA: Diagnosis not present

## 2017-07-19 DIAGNOSIS — M549 Dorsalgia, unspecified: Secondary | ICD-10-CM | POA: Diagnosis not present

## 2017-07-19 DIAGNOSIS — C787 Secondary malignant neoplasm of liver and intrahepatic bile duct: Secondary | ICD-10-CM | POA: Diagnosis not present

## 2017-07-19 DIAGNOSIS — Z5111 Encounter for antineoplastic chemotherapy: Secondary | ICD-10-CM | POA: Diagnosis not present

## 2017-07-25 DIAGNOSIS — J159 Unspecified bacterial pneumonia: Secondary | ICD-10-CM | POA: Diagnosis not present

## 2017-07-25 DIAGNOSIS — Z681 Body mass index (BMI) 19 or less, adult: Secondary | ICD-10-CM | POA: Diagnosis not present

## 2017-07-25 DIAGNOSIS — C250 Malignant neoplasm of head of pancreas: Secondary | ICD-10-CM | POA: Diagnosis not present

## 2017-07-25 DIAGNOSIS — R05 Cough: Secondary | ICD-10-CM | POA: Diagnosis not present

## 2017-07-30 DIAGNOSIS — C787 Secondary malignant neoplasm of liver and intrahepatic bile duct: Secondary | ICD-10-CM | POA: Diagnosis not present

## 2017-07-30 DIAGNOSIS — D649 Anemia, unspecified: Secondary | ICD-10-CM | POA: Diagnosis not present

## 2017-07-30 DIAGNOSIS — Z5111 Encounter for antineoplastic chemotherapy: Secondary | ICD-10-CM | POA: Diagnosis not present

## 2017-07-30 DIAGNOSIS — J439 Emphysema, unspecified: Secondary | ICD-10-CM | POA: Diagnosis not present

## 2017-07-30 DIAGNOSIS — D509 Iron deficiency anemia, unspecified: Secondary | ICD-10-CM | POA: Diagnosis not present

## 2017-07-30 DIAGNOSIS — M549 Dorsalgia, unspecified: Secondary | ICD-10-CM | POA: Diagnosis not present

## 2017-07-30 DIAGNOSIS — C241 Malignant neoplasm of ampulla of Vater: Secondary | ICD-10-CM | POA: Diagnosis not present

## 2017-07-30 DIAGNOSIS — C772 Secondary and unspecified malignant neoplasm of intra-abdominal lymph nodes: Secondary | ICD-10-CM | POA: Diagnosis not present

## 2017-07-30 DIAGNOSIS — K219 Gastro-esophageal reflux disease without esophagitis: Secondary | ICD-10-CM | POA: Diagnosis not present

## 2017-08-01 DIAGNOSIS — D509 Iron deficiency anemia, unspecified: Secondary | ICD-10-CM | POA: Diagnosis not present

## 2017-08-01 DIAGNOSIS — J439 Emphysema, unspecified: Secondary | ICD-10-CM | POA: Diagnosis not present

## 2017-08-01 DIAGNOSIS — Z5111 Encounter for antineoplastic chemotherapy: Secondary | ICD-10-CM | POA: Diagnosis not present

## 2017-08-01 DIAGNOSIS — C241 Malignant neoplasm of ampulla of Vater: Secondary | ICD-10-CM | POA: Diagnosis not present

## 2017-08-01 DIAGNOSIS — K219 Gastro-esophageal reflux disease without esophagitis: Secondary | ICD-10-CM | POA: Diagnosis not present

## 2017-08-01 DIAGNOSIS — C787 Secondary malignant neoplasm of liver and intrahepatic bile duct: Secondary | ICD-10-CM | POA: Diagnosis not present

## 2017-08-01 DIAGNOSIS — M549 Dorsalgia, unspecified: Secondary | ICD-10-CM | POA: Diagnosis not present

## 2017-08-01 DIAGNOSIS — C772 Secondary and unspecified malignant neoplasm of intra-abdominal lymph nodes: Secondary | ICD-10-CM | POA: Diagnosis not present

## 2017-08-16 DIAGNOSIS — Z79899 Other long term (current) drug therapy: Secondary | ICD-10-CM | POA: Diagnosis not present

## 2017-08-16 DIAGNOSIS — G4700 Insomnia, unspecified: Secondary | ICD-10-CM | POA: Diagnosis not present

## 2017-08-16 DIAGNOSIS — J439 Emphysema, unspecified: Secondary | ICD-10-CM | POA: Diagnosis not present

## 2017-08-16 DIAGNOSIS — M549 Dorsalgia, unspecified: Secondary | ICD-10-CM | POA: Diagnosis not present

## 2017-08-16 DIAGNOSIS — R5383 Other fatigue: Secondary | ICD-10-CM | POA: Diagnosis not present

## 2017-08-16 DIAGNOSIS — C787 Secondary malignant neoplasm of liver and intrahepatic bile duct: Secondary | ICD-10-CM | POA: Diagnosis not present

## 2017-08-16 DIAGNOSIS — C241 Malignant neoplasm of ampulla of Vater: Secondary | ICD-10-CM | POA: Diagnosis not present

## 2017-08-18 DIAGNOSIS — C241 Malignant neoplasm of ampulla of Vater: Secondary | ICD-10-CM | POA: Diagnosis not present

## 2017-08-18 DIAGNOSIS — G4700 Insomnia, unspecified: Secondary | ICD-10-CM | POA: Diagnosis not present

## 2017-08-18 DIAGNOSIS — Z79899 Other long term (current) drug therapy: Secondary | ICD-10-CM | POA: Diagnosis not present

## 2017-08-18 DIAGNOSIS — C787 Secondary malignant neoplasm of liver and intrahepatic bile duct: Secondary | ICD-10-CM | POA: Diagnosis not present

## 2017-08-18 DIAGNOSIS — R5383 Other fatigue: Secondary | ICD-10-CM | POA: Diagnosis not present

## 2017-08-19 DIAGNOSIS — Z79899 Other long term (current) drug therapy: Secondary | ICD-10-CM | POA: Diagnosis not present

## 2017-08-19 DIAGNOSIS — C241 Malignant neoplasm of ampulla of Vater: Secondary | ICD-10-CM | POA: Diagnosis not present

## 2017-08-19 DIAGNOSIS — R5383 Other fatigue: Secondary | ICD-10-CM | POA: Diagnosis not present

## 2017-08-19 DIAGNOSIS — G4700 Insomnia, unspecified: Secondary | ICD-10-CM | POA: Diagnosis not present

## 2017-08-19 DIAGNOSIS — C787 Secondary malignant neoplasm of liver and intrahepatic bile duct: Secondary | ICD-10-CM | POA: Diagnosis not present

## 2017-08-19 DIAGNOSIS — K210 Gastro-esophageal reflux disease with esophagitis: Secondary | ICD-10-CM | POA: Diagnosis not present

## 2017-08-29 DIAGNOSIS — C241 Malignant neoplasm of ampulla of Vater: Secondary | ICD-10-CM | POA: Diagnosis not present

## 2017-09-01 DIAGNOSIS — R197 Diarrhea, unspecified: Secondary | ICD-10-CM | POA: Diagnosis not present

## 2017-09-01 DIAGNOSIS — J9809 Other diseases of bronchus, not elsewhere classified: Secondary | ICD-10-CM | POA: Diagnosis not present

## 2017-09-01 DIAGNOSIS — K529 Noninfective gastroenteritis and colitis, unspecified: Secondary | ICD-10-CM | POA: Diagnosis not present

## 2017-09-01 DIAGNOSIS — Z87891 Personal history of nicotine dependence: Secondary | ICD-10-CM | POA: Diagnosis not present

## 2017-09-01 DIAGNOSIS — R5383 Other fatigue: Secondary | ICD-10-CM | POA: Diagnosis not present

## 2017-09-01 DIAGNOSIS — R509 Fever, unspecified: Secondary | ICD-10-CM | POA: Diagnosis not present

## 2017-09-01 DIAGNOSIS — Z79899 Other long term (current) drug therapy: Secondary | ICD-10-CM | POA: Diagnosis not present

## 2017-09-01 DIAGNOSIS — C228 Malignant neoplasm of liver, primary, unspecified as to type: Secondary | ICD-10-CM | POA: Diagnosis not present

## 2017-09-01 DIAGNOSIS — R5381 Other malaise: Secondary | ICD-10-CM | POA: Diagnosis not present

## 2017-09-01 DIAGNOSIS — R112 Nausea with vomiting, unspecified: Secondary | ICD-10-CM | POA: Diagnosis not present

## 2017-09-01 DIAGNOSIS — Z79891 Long term (current) use of opiate analgesic: Secondary | ICD-10-CM | POA: Diagnosis not present

## 2017-09-10 DIAGNOSIS — J439 Emphysema, unspecified: Secondary | ICD-10-CM | POA: Diagnosis not present

## 2017-09-10 DIAGNOSIS — E860 Dehydration: Secondary | ICD-10-CM | POA: Diagnosis not present

## 2017-09-10 DIAGNOSIS — R109 Unspecified abdominal pain: Secondary | ICD-10-CM | POA: Diagnosis not present

## 2017-09-10 DIAGNOSIS — C241 Malignant neoplasm of ampulla of Vater: Secondary | ICD-10-CM | POA: Diagnosis not present

## 2017-09-10 DIAGNOSIS — G2581 Restless legs syndrome: Secondary | ICD-10-CM | POA: Diagnosis not present

## 2017-09-10 DIAGNOSIS — R079 Chest pain, unspecified: Secondary | ICD-10-CM | POA: Diagnosis not present

## 2017-09-10 DIAGNOSIS — C787 Secondary malignant neoplasm of liver and intrahepatic bile duct: Secondary | ICD-10-CM | POA: Diagnosis not present

## 2017-09-10 DIAGNOSIS — K210 Gastro-esophageal reflux disease with esophagitis: Secondary | ICD-10-CM | POA: Diagnosis not present

## 2017-09-10 DIAGNOSIS — M545 Low back pain: Secondary | ICD-10-CM | POA: Diagnosis not present

## 2017-09-11 DIAGNOSIS — M79602 Pain in left arm: Secondary | ICD-10-CM | POA: Diagnosis not present

## 2017-09-11 DIAGNOSIS — M25511 Pain in right shoulder: Secondary | ICD-10-CM | POA: Diagnosis not present

## 2017-09-11 DIAGNOSIS — E860 Dehydration: Secondary | ICD-10-CM | POA: Diagnosis not present

## 2017-09-11 DIAGNOSIS — R109 Unspecified abdominal pain: Secondary | ICD-10-CM | POA: Diagnosis not present

## 2017-09-11 DIAGNOSIS — K429 Umbilical hernia without obstruction or gangrene: Secondary | ICD-10-CM | POA: Diagnosis not present

## 2017-09-11 DIAGNOSIS — M545 Low back pain: Secondary | ICD-10-CM | POA: Diagnosis not present

## 2017-09-11 DIAGNOSIS — C787 Secondary malignant neoplasm of liver and intrahepatic bile duct: Secondary | ICD-10-CM | POA: Diagnosis not present

## 2017-09-11 DIAGNOSIS — C241 Malignant neoplasm of ampulla of Vater: Secondary | ICD-10-CM | POA: Diagnosis not present

## 2017-09-11 DIAGNOSIS — M7989 Other specified soft tissue disorders: Secondary | ICD-10-CM | POA: Diagnosis not present

## 2017-09-12 DIAGNOSIS — M545 Low back pain: Secondary | ICD-10-CM | POA: Diagnosis not present

## 2017-09-12 DIAGNOSIS — C787 Secondary malignant neoplasm of liver and intrahepatic bile duct: Secondary | ICD-10-CM | POA: Diagnosis not present

## 2017-09-12 DIAGNOSIS — C241 Malignant neoplasm of ampulla of Vater: Secondary | ICD-10-CM | POA: Diagnosis not present

## 2017-09-12 DIAGNOSIS — R110 Nausea: Secondary | ICD-10-CM | POA: Diagnosis not present

## 2017-09-12 DIAGNOSIS — R109 Unspecified abdominal pain: Secondary | ICD-10-CM | POA: Diagnosis not present

## 2017-09-12 DIAGNOSIS — E860 Dehydration: Secondary | ICD-10-CM | POA: Diagnosis not present

## 2017-09-13 DIAGNOSIS — F419 Anxiety disorder, unspecified: Secondary | ICD-10-CM | POA: Diagnosis not present

## 2017-09-13 DIAGNOSIS — F4321 Adjustment disorder with depressed mood: Secondary | ICD-10-CM | POA: Diagnosis not present

## 2017-10-15 DIAGNOSIS — J439 Emphysema, unspecified: Secondary | ICD-10-CM | POA: Diagnosis not present

## 2017-10-15 DIAGNOSIS — K429 Umbilical hernia without obstruction or gangrene: Secondary | ICD-10-CM | POA: Diagnosis not present

## 2017-10-15 DIAGNOSIS — C787 Secondary malignant neoplasm of liver and intrahepatic bile duct: Secondary | ICD-10-CM | POA: Diagnosis not present

## 2017-10-15 DIAGNOSIS — R109 Unspecified abdominal pain: Secondary | ICD-10-CM | POA: Diagnosis not present

## 2017-10-15 DIAGNOSIS — C772 Secondary and unspecified malignant neoplasm of intra-abdominal lymph nodes: Secondary | ICD-10-CM | POA: Diagnosis not present

## 2017-10-15 DIAGNOSIS — D509 Iron deficiency anemia, unspecified: Secondary | ICD-10-CM | POA: Diagnosis not present

## 2017-10-15 DIAGNOSIS — R935 Abnormal findings on diagnostic imaging of other abdominal regions, including retroperitoneum: Secondary | ICD-10-CM | POA: Diagnosis not present

## 2017-10-15 DIAGNOSIS — C241 Malignant neoplasm of ampulla of Vater: Secondary | ICD-10-CM | POA: Diagnosis not present

## 2017-10-15 DIAGNOSIS — G629 Polyneuropathy, unspecified: Secondary | ICD-10-CM | POA: Diagnosis not present

## 2017-10-15 DIAGNOSIS — Z87891 Personal history of nicotine dependence: Secondary | ICD-10-CM | POA: Diagnosis not present

## 2017-10-16 DIAGNOSIS — D649 Anemia, unspecified: Secondary | ICD-10-CM | POA: Diagnosis not present

## 2017-10-16 DIAGNOSIS — M545 Low back pain: Secondary | ICD-10-CM | POA: Diagnosis not present

## 2017-10-16 DIAGNOSIS — C241 Malignant neoplasm of ampulla of Vater: Secondary | ICD-10-CM | POA: Diagnosis not present

## 2017-10-16 DIAGNOSIS — C787 Secondary malignant neoplasm of liver and intrahepatic bile duct: Secondary | ICD-10-CM | POA: Diagnosis not present

## 2017-10-16 DIAGNOSIS — G629 Polyneuropathy, unspecified: Secondary | ICD-10-CM | POA: Diagnosis not present

## 2017-10-16 DIAGNOSIS — R1011 Right upper quadrant pain: Secondary | ICD-10-CM | POA: Diagnosis not present

## 2017-10-28 DIAGNOSIS — Z9049 Acquired absence of other specified parts of digestive tract: Secondary | ICD-10-CM | POA: Diagnosis not present

## 2017-10-28 DIAGNOSIS — Z87891 Personal history of nicotine dependence: Secondary | ICD-10-CM | POA: Diagnosis not present

## 2017-10-28 DIAGNOSIS — C787 Secondary malignant neoplasm of liver and intrahepatic bile duct: Secondary | ICD-10-CM | POA: Diagnosis not present

## 2017-10-28 DIAGNOSIS — J439 Emphysema, unspecified: Secondary | ICD-10-CM | POA: Diagnosis not present

## 2017-10-28 DIAGNOSIS — C772 Secondary and unspecified malignant neoplasm of intra-abdominal lymph nodes: Secondary | ICD-10-CM | POA: Diagnosis not present

## 2017-10-28 DIAGNOSIS — Z5111 Encounter for antineoplastic chemotherapy: Secondary | ICD-10-CM | POA: Diagnosis not present

## 2017-10-28 DIAGNOSIS — D509 Iron deficiency anemia, unspecified: Secondary | ICD-10-CM | POA: Diagnosis not present

## 2017-10-28 DIAGNOSIS — G629 Polyneuropathy, unspecified: Secondary | ICD-10-CM | POA: Diagnosis not present

## 2017-10-28 DIAGNOSIS — R0602 Shortness of breath: Secondary | ICD-10-CM | POA: Diagnosis not present

## 2017-10-28 DIAGNOSIS — T451X5A Adverse effect of antineoplastic and immunosuppressive drugs, initial encounter: Secondary | ICD-10-CM | POA: Diagnosis not present

## 2017-10-28 DIAGNOSIS — R17 Unspecified jaundice: Secondary | ICD-10-CM | POA: Diagnosis not present

## 2017-10-28 DIAGNOSIS — M549 Dorsalgia, unspecified: Secondary | ICD-10-CM | POA: Diagnosis not present

## 2017-10-28 DIAGNOSIS — Z79899 Other long term (current) drug therapy: Secondary | ICD-10-CM | POA: Diagnosis not present

## 2017-10-28 DIAGNOSIS — C241 Malignant neoplasm of ampulla of Vater: Secondary | ICD-10-CM | POA: Diagnosis not present

## 2017-11-11 DIAGNOSIS — C241 Malignant neoplasm of ampulla of Vater: Secondary | ICD-10-CM | POA: Diagnosis not present

## 2017-11-11 DIAGNOSIS — M545 Low back pain: Secondary | ICD-10-CM | POA: Diagnosis not present

## 2017-11-11 DIAGNOSIS — C787 Secondary malignant neoplasm of liver and intrahepatic bile duct: Secondary | ICD-10-CM | POA: Diagnosis not present

## 2017-11-11 DIAGNOSIS — M549 Dorsalgia, unspecified: Secondary | ICD-10-CM | POA: Diagnosis not present

## 2017-11-11 DIAGNOSIS — R17 Unspecified jaundice: Secondary | ICD-10-CM | POA: Diagnosis not present

## 2017-11-11 DIAGNOSIS — J439 Emphysema, unspecified: Secondary | ICD-10-CM | POA: Diagnosis not present

## 2017-11-11 DIAGNOSIS — Z5111 Encounter for antineoplastic chemotherapy: Secondary | ICD-10-CM | POA: Diagnosis not present

## 2017-11-11 DIAGNOSIS — M542 Cervicalgia: Secondary | ICD-10-CM | POA: Diagnosis not present

## 2017-11-11 DIAGNOSIS — R109 Unspecified abdominal pain: Secondary | ICD-10-CM | POA: Diagnosis not present

## 2017-11-11 DIAGNOSIS — D509 Iron deficiency anemia, unspecified: Secondary | ICD-10-CM | POA: Diagnosis not present

## 2017-11-11 DIAGNOSIS — Z79899 Other long term (current) drug therapy: Secondary | ICD-10-CM | POA: Diagnosis not present

## 2017-11-11 DIAGNOSIS — G629 Polyneuropathy, unspecified: Secondary | ICD-10-CM | POA: Diagnosis not present

## 2017-11-11 DIAGNOSIS — G893 Neoplasm related pain (acute) (chronic): Secondary | ICD-10-CM | POA: Diagnosis not present

## 2017-11-25 DIAGNOSIS — G629 Polyneuropathy, unspecified: Secondary | ICD-10-CM | POA: Diagnosis not present

## 2017-11-25 DIAGNOSIS — K5900 Constipation, unspecified: Secondary | ICD-10-CM | POA: Diagnosis not present

## 2017-11-25 DIAGNOSIS — R109 Unspecified abdominal pain: Secondary | ICD-10-CM | POA: Diagnosis not present

## 2017-11-25 DIAGNOSIS — C241 Malignant neoplasm of ampulla of Vater: Secondary | ICD-10-CM | POA: Diagnosis not present

## 2017-11-25 DIAGNOSIS — D509 Iron deficiency anemia, unspecified: Secondary | ICD-10-CM | POA: Diagnosis not present

## 2017-11-25 DIAGNOSIS — J439 Emphysema, unspecified: Secondary | ICD-10-CM | POA: Diagnosis not present

## 2017-11-25 DIAGNOSIS — M25512 Pain in left shoulder: Secondary | ICD-10-CM | POA: Diagnosis not present

## 2017-11-25 DIAGNOSIS — M545 Low back pain: Secondary | ICD-10-CM | POA: Diagnosis not present

## 2017-11-25 DIAGNOSIS — R0602 Shortness of breath: Secondary | ICD-10-CM | POA: Diagnosis not present

## 2017-11-25 DIAGNOSIS — Z5111 Encounter for antineoplastic chemotherapy: Secondary | ICD-10-CM | POA: Diagnosis not present

## 2017-11-25 DIAGNOSIS — R5383 Other fatigue: Secondary | ICD-10-CM | POA: Diagnosis not present

## 2017-11-25 DIAGNOSIS — Z79899 Other long term (current) drug therapy: Secondary | ICD-10-CM | POA: Diagnosis not present

## 2017-11-25 DIAGNOSIS — F418 Other specified anxiety disorders: Secondary | ICD-10-CM | POA: Diagnosis not present

## 2017-11-25 DIAGNOSIS — M25511 Pain in right shoulder: Secondary | ICD-10-CM | POA: Diagnosis not present

## 2017-11-25 DIAGNOSIS — G620 Drug-induced polyneuropathy: Secondary | ICD-10-CM | POA: Diagnosis not present

## 2017-11-25 DIAGNOSIS — G4700 Insomnia, unspecified: Secondary | ICD-10-CM | POA: Diagnosis not present

## 2017-11-25 DIAGNOSIS — C772 Secondary and unspecified malignant neoplasm of intra-abdominal lymph nodes: Secondary | ICD-10-CM | POA: Diagnosis not present

## 2017-11-25 DIAGNOSIS — C787 Secondary malignant neoplasm of liver and intrahepatic bile duct: Secondary | ICD-10-CM | POA: Diagnosis not present

## 2017-11-25 DIAGNOSIS — T451X5A Adverse effect of antineoplastic and immunosuppressive drugs, initial encounter: Secondary | ICD-10-CM | POA: Diagnosis not present

## 2017-12-09 DIAGNOSIS — T451X5A Adverse effect of antineoplastic and immunosuppressive drugs, initial encounter: Secondary | ICD-10-CM | POA: Diagnosis not present

## 2017-12-09 DIAGNOSIS — Z9049 Acquired absence of other specified parts of digestive tract: Secondary | ICD-10-CM | POA: Diagnosis not present

## 2017-12-09 DIAGNOSIS — F4323 Adjustment disorder with mixed anxiety and depressed mood: Secondary | ICD-10-CM | POA: Diagnosis not present

## 2017-12-09 DIAGNOSIS — Z5111 Encounter for antineoplastic chemotherapy: Secondary | ICD-10-CM | POA: Diagnosis not present

## 2017-12-09 DIAGNOSIS — G629 Polyneuropathy, unspecified: Secondary | ICD-10-CM | POA: Diagnosis not present

## 2017-12-09 DIAGNOSIS — M549 Dorsalgia, unspecified: Secondary | ICD-10-CM | POA: Diagnosis not present

## 2017-12-09 DIAGNOSIS — F419 Anxiety disorder, unspecified: Secondary | ICD-10-CM | POA: Diagnosis not present

## 2017-12-09 DIAGNOSIS — G4700 Insomnia, unspecified: Secondary | ICD-10-CM | POA: Diagnosis not present

## 2017-12-09 DIAGNOSIS — C787 Secondary malignant neoplasm of liver and intrahepatic bile duct: Secondary | ICD-10-CM | POA: Diagnosis not present

## 2017-12-09 DIAGNOSIS — C772 Secondary and unspecified malignant neoplasm of intra-abdominal lymph nodes: Secondary | ICD-10-CM | POA: Diagnosis not present

## 2017-12-09 DIAGNOSIS — J439 Emphysema, unspecified: Secondary | ICD-10-CM | POA: Diagnosis not present

## 2017-12-09 DIAGNOSIS — C241 Malignant neoplasm of ampulla of Vater: Secondary | ICD-10-CM | POA: Diagnosis not present

## 2017-12-09 DIAGNOSIS — L658 Other specified nonscarring hair loss: Secondary | ICD-10-CM | POA: Diagnosis not present

## 2017-12-09 DIAGNOSIS — D509 Iron deficiency anemia, unspecified: Secondary | ICD-10-CM | POA: Diagnosis not present

## 2017-12-09 DIAGNOSIS — Z79899 Other long term (current) drug therapy: Secondary | ICD-10-CM | POA: Diagnosis not present

## 2017-12-10 DIAGNOSIS — G4700 Insomnia, unspecified: Secondary | ICD-10-CM | POA: Diagnosis not present

## 2017-12-10 DIAGNOSIS — F4323 Adjustment disorder with mixed anxiety and depressed mood: Secondary | ICD-10-CM | POA: Diagnosis not present

## 2017-12-17 DIAGNOSIS — R1110 Vomiting, unspecified: Secondary | ICD-10-CM | POA: Diagnosis not present

## 2017-12-17 DIAGNOSIS — R5381 Other malaise: Secondary | ICD-10-CM | POA: Diagnosis not present

## 2017-12-17 DIAGNOSIS — Z87891 Personal history of nicotine dependence: Secondary | ICD-10-CM | POA: Diagnosis not present

## 2017-12-17 DIAGNOSIS — Z79899 Other long term (current) drug therapy: Secondary | ICD-10-CM | POA: Diagnosis not present

## 2017-12-17 DIAGNOSIS — C229 Malignant neoplasm of liver, not specified as primary or secondary: Secondary | ICD-10-CM | POA: Diagnosis not present

## 2017-12-23 DIAGNOSIS — M545 Low back pain: Secondary | ICD-10-CM | POA: Diagnosis not present

## 2017-12-23 DIAGNOSIS — G629 Polyneuropathy, unspecified: Secondary | ICD-10-CM | POA: Diagnosis not present

## 2017-12-23 DIAGNOSIS — C787 Secondary malignant neoplasm of liver and intrahepatic bile duct: Secondary | ICD-10-CM | POA: Diagnosis not present

## 2017-12-23 DIAGNOSIS — D509 Iron deficiency anemia, unspecified: Secondary | ICD-10-CM | POA: Diagnosis not present

## 2017-12-23 DIAGNOSIS — R17 Unspecified jaundice: Secondary | ICD-10-CM | POA: Diagnosis not present

## 2017-12-23 DIAGNOSIS — M549 Dorsalgia, unspecified: Secondary | ICD-10-CM | POA: Diagnosis not present

## 2017-12-23 DIAGNOSIS — C241 Malignant neoplasm of ampulla of Vater: Secondary | ICD-10-CM | POA: Diagnosis not present

## 2017-12-23 DIAGNOSIS — R1011 Right upper quadrant pain: Secondary | ICD-10-CM | POA: Diagnosis not present

## 2017-12-23 DIAGNOSIS — G620 Drug-induced polyneuropathy: Secondary | ICD-10-CM | POA: Diagnosis not present

## 2017-12-23 DIAGNOSIS — Z5111 Encounter for antineoplastic chemotherapy: Secondary | ICD-10-CM | POA: Diagnosis not present

## 2017-12-23 DIAGNOSIS — T451X5A Adverse effect of antineoplastic and immunosuppressive drugs, initial encounter: Secondary | ICD-10-CM | POA: Diagnosis not present

## 2017-12-23 DIAGNOSIS — D500 Iron deficiency anemia secondary to blood loss (chronic): Secondary | ICD-10-CM | POA: Diagnosis not present

## 2017-12-23 DIAGNOSIS — R109 Unspecified abdominal pain: Secondary | ICD-10-CM | POA: Diagnosis not present

## 2017-12-23 DIAGNOSIS — J439 Emphysema, unspecified: Secondary | ICD-10-CM | POA: Diagnosis not present

## 2017-12-23 DIAGNOSIS — Z79899 Other long term (current) drug therapy: Secondary | ICD-10-CM | POA: Diagnosis not present

## 2017-12-23 DIAGNOSIS — G8929 Other chronic pain: Secondary | ICD-10-CM | POA: Diagnosis not present

## 2017-12-23 DIAGNOSIS — F1190 Opioid use, unspecified, uncomplicated: Secondary | ICD-10-CM | POA: Diagnosis not present

## 2018-01-06 DIAGNOSIS — Z5111 Encounter for antineoplastic chemotherapy: Secondary | ICD-10-CM | POA: Diagnosis not present

## 2018-01-06 DIAGNOSIS — Z79899 Other long term (current) drug therapy: Secondary | ICD-10-CM | POA: Diagnosis not present

## 2018-01-06 DIAGNOSIS — C241 Malignant neoplasm of ampulla of Vater: Secondary | ICD-10-CM | POA: Diagnosis not present

## 2018-01-16 IMAGING — CT CT CHEST W/ CM
2 of 5 series · 12 of 36 positions shown, 15 images · IV contrast (Isovue)
Comparison: Prior studies 10/29/2016.

CLINICAL DATA: Bright red rectal bleeding this morning. Right-sided
abdominal pain with nausea. History of pancreatic cancer with
Whipple procedure.

EXAM:
CT CHEST, ABDOMEN, AND PELVIS WITH CONTRAST
TECHNIQUE: Multidetector CT imaging of the chest, abdomen and pelvis was
performed following the standard protocol during bolus
administration of intravenous contrast.
CONTRAST:  100mL J2P4PM-QLL IOPAMIDOL (J2P4PM-QLL) INJECTION 61%

[Series 2: cap with · axial · 0.84mm/px · z∈[+998,+1548]mm · 9 of 134 slices shown, 12 images]
[im 12/134  mediastinal]
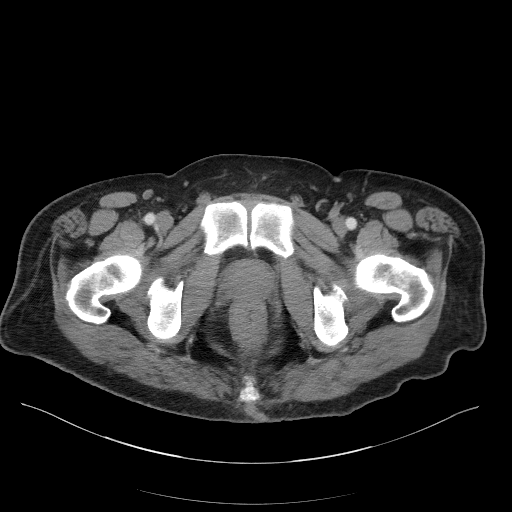
[im 12/134  lung]
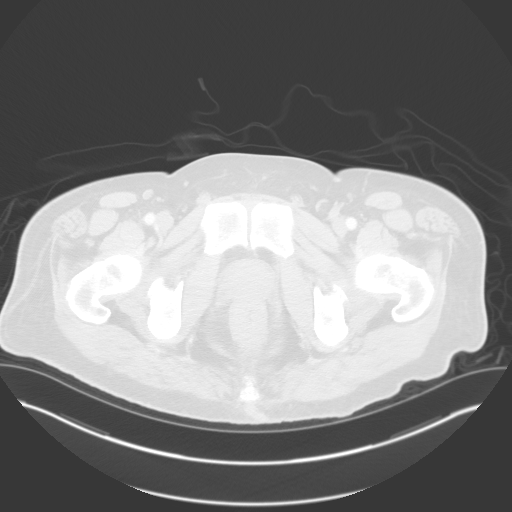
[im 23/134  lung]
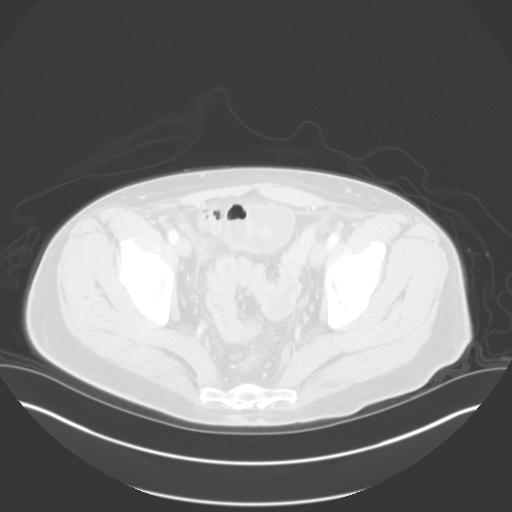
[im 45/134  lung]
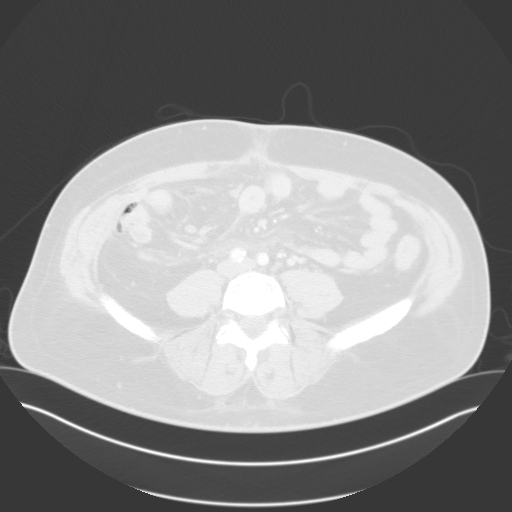
[im 56/134  lung]
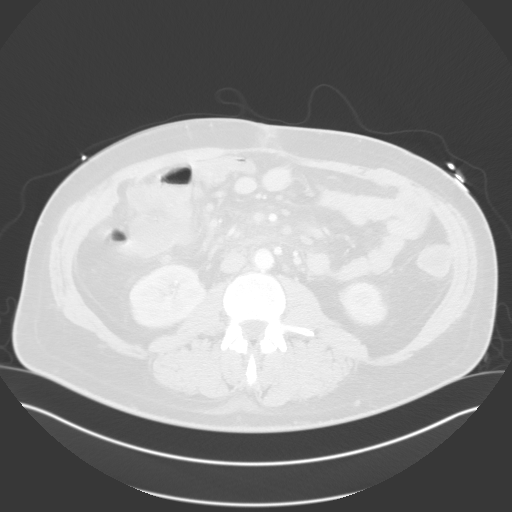
[im 67/134  mediastinal]
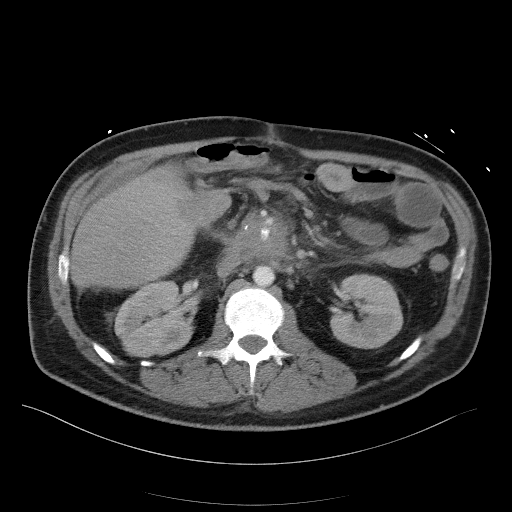
[im 67/134  lung]
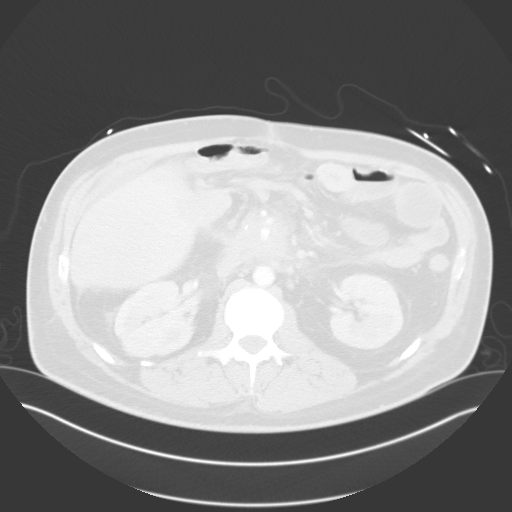
[im 78/134  lung]
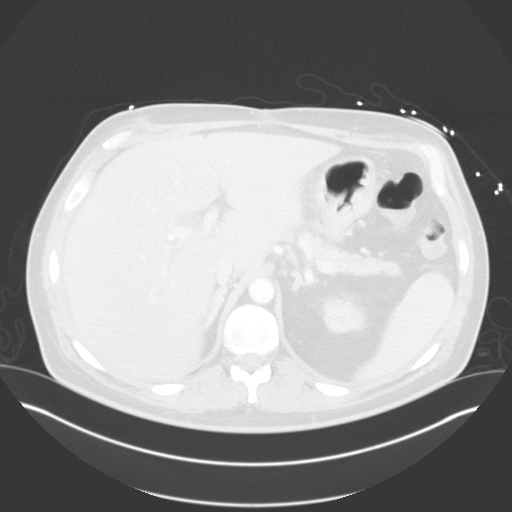
[im 89/134  lung]
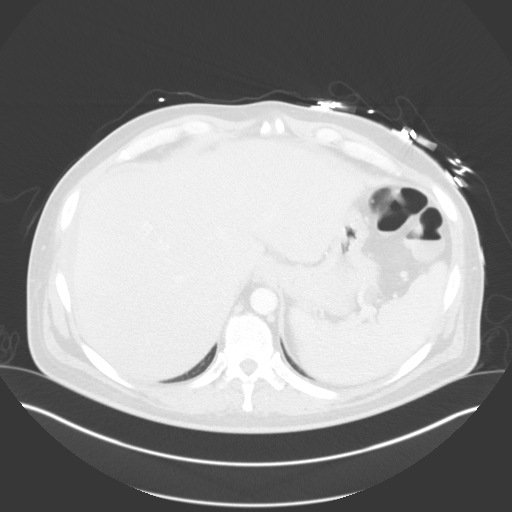
[im 111/134  lung]
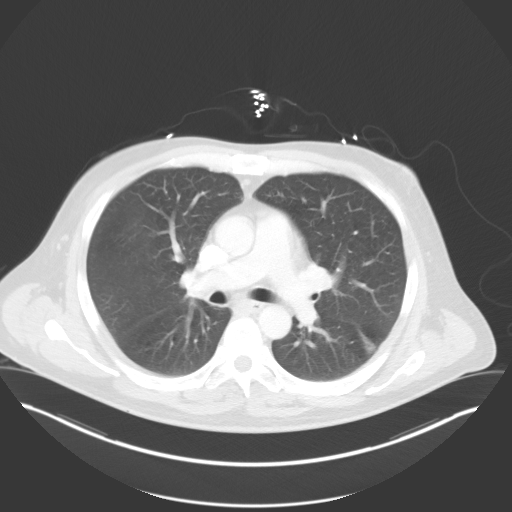
[im 122/134  mediastinal]
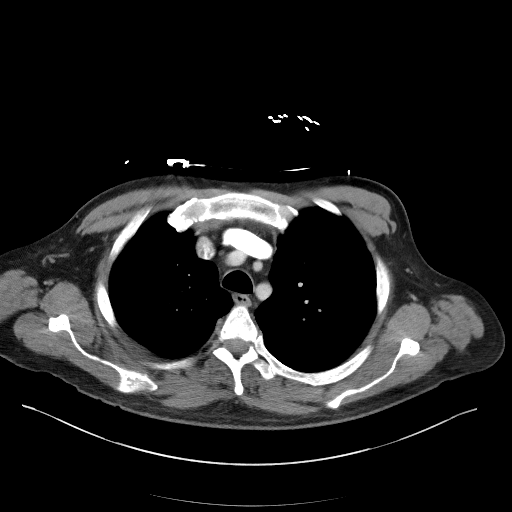
[im 122/134  lung]
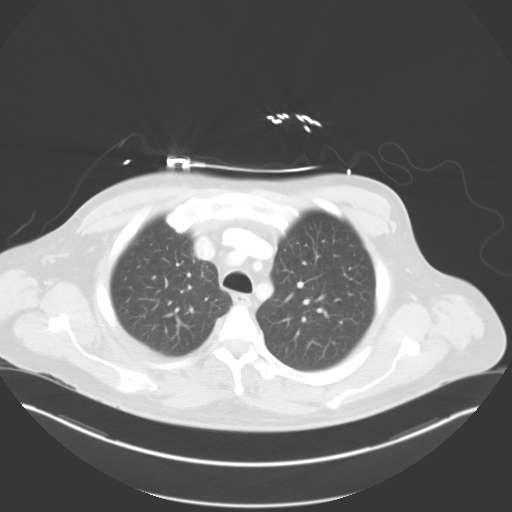

[Series 4: coronals · coronal · 0.82mm/px · 3 of 153 slices shown]
[im 31/153  lung]
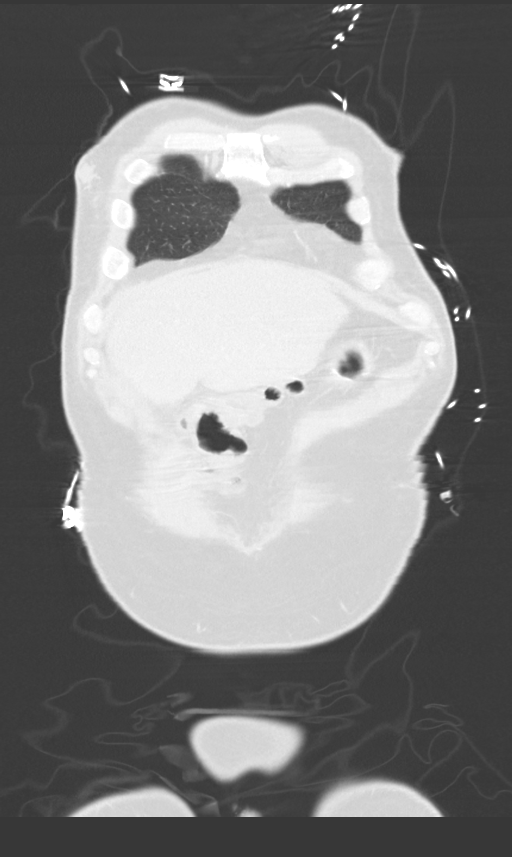
[im 61/153  lung]
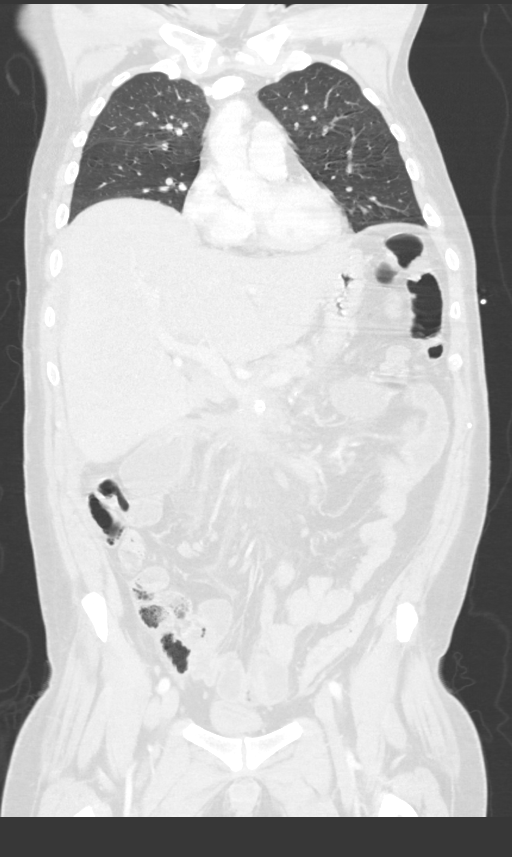
[im 92/153  lung]
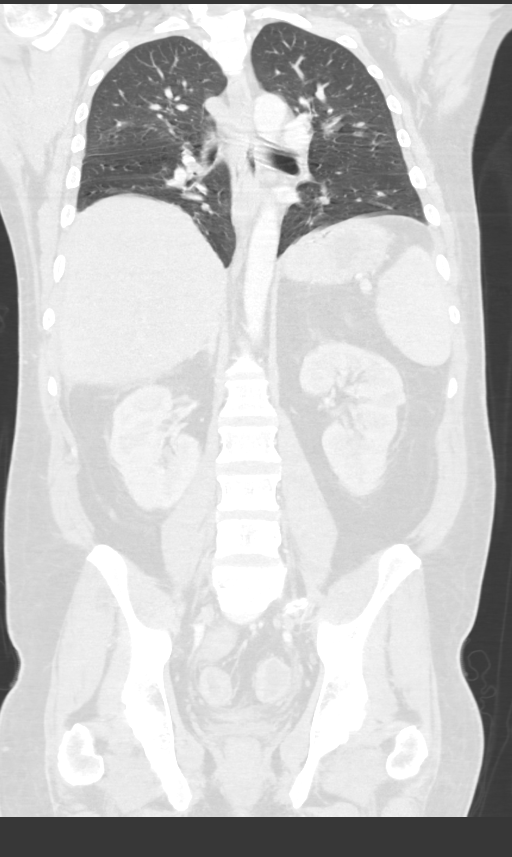

[12 of 36 positions shown; findings below may reference images not displayed]

FINDINGS: CT CHEST FINDINGS

Cardiovascular: There are no significant vascular findings. The
heart size is normal. There is no pericardial effusion.

Mediastinum/Nodes: There are no enlarged mediastinal, hilar or
axillary lymph nodes. The thyroid gland, trachea and esophagus
demonstrate no significant findings.

Lungs/Pleura: There is no pleural effusion. Interval improved
aeration of the lung bases with mild residual atelectasis. No
confluent airspace opacity or suspicious pulmonary nodule.

Musculoskeletal/Chest wall: No chest wall mass or suspicious osseous
findings. Bilateral gynecomastia.

CT ABDOMEN AND PELVIS FINDINGS

Hepatobiliary: No focal hepatic abnormalities are identified. There
is no significant biliary dilatation. No pneumobilia identified
following Whipple procedure.

Pancreas: Status post Whipple procedure. The pancreatic body and
tail appear stable. There is an enlarging retroperitoneal mass
surrounding the SMA stent, further described below.

Spleen: Normal in size without suspicious abnormality. Small
low-density lesion centrally is stable. There is a small splenule.

Adrenals/Urinary Tract: Both adrenal glands appear normal. There are
small nonobstructing bilateral renal calculi. No evidence of
ureteral calculus, hydronephrosis or delay in contrast excretion.
There is bladder wall thickening which is in part secondary to
incomplete distention. No surrounding inflammation.

Stomach/Bowel: No evidence of bowel wall thickening, distention or
surrounding inflammatory change. The appendix appears normal. Stable
postsurgical changes from previous Whipple procedure.

Vascular/Lymphatic: Within the surgical bed, there is an enlarging
soft tissue mass encasing the SMA stent. This measures up to 3.7 x
4.5 cm transverse on image 67 of series 2. It extends 6.8 cm
cephalocaudad on coronal image 69. The SMA stent appears patent,
although there is suggested stenosis of the SMA just distal to the
stent. No evidence of arterial occlusion. However, the superior
mesenteric vein appears occluded. The portal and splenic veins are
patent. There is mild iliac atherosclerosis. Apart from the dominant
retroperitoneal mass, there are only scattered small mesenteric
lymph nodes which are stable.

Reproductive: The prostate gland and seminal vesicles appear normal.

Other: There is no significant ascites. There is mild mesenteric
edema without focal fluid collection.

Musculoskeletal: No acute or significant osseous findings.
IMPRESSION: 1. No clear explanation for rectal bleeding identified.
2. Findings are highly worrisome for pancreatic recurrence in the
surgical bed, with encasement of the superior mesenteric artery and
vein. The superior mesenteric vein is occluded. There is probable
stenosis of the SMA just distal to the SMA stent.
3. No distant metastases identified.
4. Bladder wall thickening, likely in part secondary to incomplete
distention.
5. No significant chest findings.

## 2018-01-25 ENCOUNTER — Encounter (HOSPITAL_COMMUNITY): Payer: Self-pay

## 2018-01-25 ENCOUNTER — Emergency Department (HOSPITAL_COMMUNITY): Payer: BLUE CROSS/BLUE SHIELD

## 2018-01-25 ENCOUNTER — Other Ambulatory Visit: Payer: Self-pay

## 2018-01-25 ENCOUNTER — Inpatient Hospital Stay (HOSPITAL_COMMUNITY)
Admission: EM | Admit: 2018-01-25 | Discharge: 2018-02-12 | DRG: 435 | Disposition: E | Payer: BLUE CROSS/BLUE SHIELD | Attending: Internal Medicine | Admitting: Internal Medicine

## 2018-01-25 ENCOUNTER — Inpatient Hospital Stay (HOSPITAL_COMMUNITY): Payer: BLUE CROSS/BLUE SHIELD

## 2018-01-25 DIAGNOSIS — E860 Dehydration: Secondary | ICD-10-CM | POA: Diagnosis present

## 2018-01-25 DIAGNOSIS — I491 Atrial premature depolarization: Secondary | ICD-10-CM | POA: Diagnosis present

## 2018-01-25 DIAGNOSIS — Z87442 Personal history of urinary calculi: Secondary | ICD-10-CM | POA: Diagnosis not present

## 2018-01-25 DIAGNOSIS — K5903 Drug induced constipation: Secondary | ICD-10-CM | POA: Diagnosis present

## 2018-01-25 DIAGNOSIS — K429 Umbilical hernia without obstruction or gangrene: Secondary | ICD-10-CM | POA: Diagnosis present

## 2018-01-25 DIAGNOSIS — Z7189 Other specified counseling: Secondary | ICD-10-CM | POA: Diagnosis not present

## 2018-01-25 DIAGNOSIS — C241 Malignant neoplasm of ampulla of Vater: Secondary | ICD-10-CM | POA: Diagnosis not present

## 2018-01-25 DIAGNOSIS — R7881 Bacteremia: Secondary | ICD-10-CM | POA: Diagnosis not present

## 2018-01-25 DIAGNOSIS — Z515 Encounter for palliative care: Secondary | ICD-10-CM

## 2018-01-25 DIAGNOSIS — Z79899 Other long term (current) drug therapy: Secondary | ICD-10-CM

## 2018-01-25 DIAGNOSIS — N3001 Acute cystitis with hematuria: Secondary | ICD-10-CM

## 2018-01-25 DIAGNOSIS — Z79891 Long term (current) use of opiate analgesic: Secondary | ICD-10-CM

## 2018-01-25 DIAGNOSIS — R0902 Hypoxemia: Secondary | ICD-10-CM | POA: Diagnosis not present

## 2018-01-25 DIAGNOSIS — Z66 Do not resuscitate: Secondary | ICD-10-CM | POA: Diagnosis not present

## 2018-01-25 DIAGNOSIS — R1084 Generalized abdominal pain: Secondary | ICD-10-CM | POA: Diagnosis not present

## 2018-01-25 DIAGNOSIS — Z87891 Personal history of nicotine dependence: Secondary | ICD-10-CM

## 2018-01-25 DIAGNOSIS — K219 Gastro-esophageal reflux disease without esophagitis: Secondary | ICD-10-CM | POA: Diagnosis present

## 2018-01-25 DIAGNOSIS — Z6826 Body mass index (BMI) 26.0-26.9, adult: Secondary | ICD-10-CM

## 2018-01-25 DIAGNOSIS — Z9049 Acquired absence of other specified parts of digestive tract: Secondary | ICD-10-CM

## 2018-01-25 DIAGNOSIS — D630 Anemia in neoplastic disease: Secondary | ICD-10-CM | POA: Diagnosis present

## 2018-01-25 DIAGNOSIS — E43 Unspecified severe protein-calorie malnutrition: Secondary | ICD-10-CM | POA: Diagnosis present

## 2018-01-25 DIAGNOSIS — G893 Neoplasm related pain (acute) (chronic): Secondary | ICD-10-CM | POA: Diagnosis present

## 2018-01-25 DIAGNOSIS — C787 Secondary malignant neoplasm of liver and intrahepatic bile duct: Secondary | ICD-10-CM | POA: Diagnosis present

## 2018-01-25 DIAGNOSIS — R0602 Shortness of breath: Secondary | ICD-10-CM

## 2018-01-25 DIAGNOSIS — T402X5A Adverse effect of other opioids, initial encounter: Secondary | ICD-10-CM | POA: Diagnosis present

## 2018-01-25 DIAGNOSIS — K7290 Hepatic failure, unspecified without coma: Secondary | ICD-10-CM | POA: Diagnosis present

## 2018-01-25 DIAGNOSIS — R531 Weakness: Secondary | ICD-10-CM

## 2018-01-25 DIAGNOSIS — B961 Klebsiella pneumoniae [K. pneumoniae] as the cause of diseases classified elsewhere: Secondary | ICD-10-CM | POA: Diagnosis present

## 2018-01-25 DIAGNOSIS — I2699 Other pulmonary embolism without acute cor pulmonale: Secondary | ICD-10-CM | POA: Diagnosis present

## 2018-01-25 DIAGNOSIS — R160 Hepatomegaly, not elsewhere classified: Secondary | ICD-10-CM | POA: Diagnosis present

## 2018-01-25 DIAGNOSIS — C259 Malignant neoplasm of pancreas, unspecified: Secondary | ICD-10-CM

## 2018-01-25 DIAGNOSIS — D649 Anemia, unspecified: Secondary | ICD-10-CM | POA: Diagnosis not present

## 2018-01-25 DIAGNOSIS — N390 Urinary tract infection, site not specified: Secondary | ICD-10-CM | POA: Diagnosis present

## 2018-01-25 DIAGNOSIS — R52 Pain, unspecified: Secondary | ICD-10-CM | POA: Diagnosis not present

## 2018-01-25 DIAGNOSIS — G8929 Other chronic pain: Secondary | ICD-10-CM | POA: Diagnosis present

## 2018-01-25 DIAGNOSIS — E440 Moderate protein-calorie malnutrition: Secondary | ICD-10-CM

## 2018-01-25 DIAGNOSIS — F4323 Adjustment disorder with mixed anxiety and depressed mood: Secondary | ICD-10-CM | POA: Diagnosis present

## 2018-01-25 DIAGNOSIS — B9689 Other specified bacterial agents as the cause of diseases classified elsewhere: Secondary | ICD-10-CM | POA: Diagnosis present

## 2018-01-25 DIAGNOSIS — K7200 Acute and subacute hepatic failure without coma: Secondary | ICD-10-CM | POA: Diagnosis present

## 2018-01-25 DIAGNOSIS — D72829 Elevated white blood cell count, unspecified: Secondary | ICD-10-CM | POA: Diagnosis present

## 2018-01-25 DIAGNOSIS — Z90411 Acquired partial absence of pancreas: Secondary | ICD-10-CM | POA: Diagnosis not present

## 2018-01-25 DIAGNOSIS — R791 Abnormal coagulation profile: Secondary | ICD-10-CM | POA: Diagnosis not present

## 2018-01-25 DIAGNOSIS — R0689 Other abnormalities of breathing: Secondary | ICD-10-CM | POA: Diagnosis not present

## 2018-01-25 LAB — CBC WITH DIFFERENTIAL/PLATELET
ABS IMMATURE GRANULOCYTES: 1.84 10*3/uL — AB (ref 0.00–0.07)
BASOS PCT: 0 %
Basophils Absolute: 0.1 10*3/uL (ref 0.0–0.1)
Eosinophils Absolute: 0 10*3/uL (ref 0.0–0.5)
Eosinophils Relative: 0 %
HCT: 25.8 % — ABNORMAL LOW (ref 39.0–52.0)
HEMOGLOBIN: 7.6 g/dL — AB (ref 13.0–17.0)
IMMATURE GRANULOCYTES: 6 %
LYMPHS ABS: 1.9 10*3/uL (ref 0.7–4.0)
LYMPHS PCT: 7 %
MCH: 26.8 pg (ref 26.0–34.0)
MCHC: 29.5 g/dL — AB (ref 30.0–36.0)
MCV: 90.8 fL (ref 80.0–100.0)
MONO ABS: 1.9 10*3/uL — AB (ref 0.1–1.0)
MONOS PCT: 7 %
NEUTROS ABS: 23.1 10*3/uL — AB (ref 1.7–7.7)
NEUTROS PCT: 80 %
Platelets: 313 10*3/uL (ref 150–400)
RBC: 2.84 MIL/uL — ABNORMAL LOW (ref 4.22–5.81)
RDW: 27.4 % — ABNORMAL HIGH (ref 11.5–15.5)
WBC: 28.7 10*3/uL — ABNORMAL HIGH (ref 4.0–10.5)
nRBC: 0.5 % — ABNORMAL HIGH (ref 0.0–0.2)

## 2018-01-25 LAB — URINALYSIS, ROUTINE W REFLEX MICROSCOPIC
Glucose, UA: NEGATIVE mg/dL
Ketones, ur: NEGATIVE mg/dL
NITRITE: POSITIVE — AB
Protein, ur: 30 mg/dL — AB
Specific Gravity, Urine: 1.019 (ref 1.005–1.030)
pH: 5 (ref 5.0–8.0)

## 2018-01-25 LAB — PROTIME-INR
INR: 2.15
PROTHROMBIN TIME: 23.7 s — AB (ref 11.4–15.2)

## 2018-01-25 LAB — COMPREHENSIVE METABOLIC PANEL
ALBUMIN: 1.9 g/dL — AB (ref 3.5–5.0)
ALT: 25 U/L (ref 0–44)
ANION GAP: 13 (ref 5–15)
AST: 78 U/L — AB (ref 15–41)
Alkaline Phosphatase: 385 U/L — ABNORMAL HIGH (ref 38–126)
BILIRUBIN TOTAL: 3.8 mg/dL — AB (ref 0.3–1.2)
BUN: 17 mg/dL (ref 6–20)
CO2: 22 mmol/L (ref 22–32)
Calcium: 8.2 mg/dL — ABNORMAL LOW (ref 8.9–10.3)
Chloride: 96 mmol/L — ABNORMAL LOW (ref 98–111)
Creatinine, Ser: 0.59 mg/dL — ABNORMAL LOW (ref 0.61–1.24)
GFR calc Af Amer: >60 mL/min (ref >60)
GFR calc non Af Amer: >60 mL/min (ref >60)
GLUCOSE: 85 mg/dL (ref 70–99)
Potassium: 4.4 mmol/L (ref 3.5–5.1)
SODIUM: 131 mmol/L — AB (ref 135–145)
TOTAL PROTEIN: 5.5 g/dL — AB (ref 6.5–8.1)

## 2018-01-25 LAB — LACTIC ACID, PLASMA: LACTIC ACID, VENOUS: 3.2 mmol/L — AB (ref 0.5–1.9)

## 2018-01-25 LAB — CK: Total CK: 21 U/L — ABNORMAL LOW (ref 49–397)

## 2018-01-25 LAB — TROPONIN I
Troponin I: 0.06 ng/mL (ref <0.03)
Troponin I: 0.05 ng/mL (ref <0.03)

## 2018-01-25 LAB — SAMPLE TO BLOOD BANK

## 2018-01-25 LAB — TSH: TSH: 1.965 u[IU]/mL (ref 0.350–4.500)

## 2018-01-25 LAB — APTT: aPTT: 39 seconds — ABNORMAL HIGH (ref 24–36)

## 2018-01-25 LAB — AMMONIA: Ammonia: 38 umol/L — ABNORMAL HIGH (ref 9–35)

## 2018-01-25 MED ORDER — BOOST / RESOURCE BREEZE PO LIQD CUSTOM
1.0000 | Freq: Three times a day (TID) | ORAL | Status: DC
  Administered 2018-01-26 – 2018-01-28 (×5): 1 via ORAL

## 2018-01-25 MED ORDER — HYDROMORPHONE HCL 1 MG/ML IJ SOLN
0.5000 mg | INTRAMUSCULAR | Status: DC | PRN
  Administered 2018-01-25 – 2018-01-26 (×3): 0.5 mg via INTRAVENOUS
  Filled 2018-01-25 (×3): qty 0.5

## 2018-01-25 MED ORDER — ENOXAPARIN SODIUM 40 MG/0.4ML ~~LOC~~ SOLN
40.0000 mg | SUBCUTANEOUS | Status: DC
  Administered 2018-01-25: 40 mg via SUBCUTANEOUS
  Filled 2018-01-25: qty 0.4

## 2018-01-25 MED ORDER — TECHNETIUM TO 99M ALBUMIN AGGREGATED
4.0000 | Freq: Once | INTRAVENOUS | Status: AC | PRN
  Administered 2018-01-25: 4.1 via INTRAVENOUS

## 2018-01-25 MED ORDER — SODIUM CHLORIDE 0.9% FLUSH
INTRAVENOUS | Status: AC
  Filled 2018-01-25: qty 50

## 2018-01-25 MED ORDER — SENNOSIDES-DOCUSATE SODIUM 8.6-50 MG PO TABS
2.0000 | ORAL_TABLET | Freq: Two times a day (BID) | ORAL | Status: DC
  Administered 2018-01-26 – 2018-01-30 (×9): 2 via ORAL
  Filled 2018-01-25 (×11): qty 2

## 2018-01-25 MED ORDER — ONDANSETRON HCL 4 MG/2ML IJ SOLN
4.0000 mg | Freq: Four times a day (QID) | INTRAMUSCULAR | Status: DC | PRN
  Administered 2018-01-28: 4 mg via INTRAVENOUS
  Filled 2018-01-25: qty 2

## 2018-01-25 MED ORDER — PRO-STAT SUGAR FREE PO LIQD
30.0000 mL | Freq: Three times a day (TID) | ORAL | Status: DC
  Administered 2018-01-27: 30 mL via ORAL
  Filled 2018-01-25 (×6): qty 30

## 2018-01-25 MED ORDER — SODIUM CHLORIDE 0.9 % IV SOLN
INTRAVENOUS | Status: DC
  Administered 2018-01-25 – 2018-01-28 (×5): via INTRAVENOUS

## 2018-01-25 MED ORDER — SODIUM CHLORIDE 0.9 % IV SOLN: INTRAVENOUS | Status: DC

## 2018-01-25 MED ORDER — OXYCODONE HCL 5 MG PO TABS
20.0000 mg | ORAL_TABLET | Freq: Once | ORAL | Status: AC
  Administered 2018-01-25: 20 mg via ORAL
  Filled 2018-01-25: qty 4

## 2018-01-25 MED ORDER — SODIUM CHLORIDE 0.9 % IV BOLUS
1000.0000 mL | Freq: Once | INTRAVENOUS | Status: AC
  Administered 2018-01-25: 1000 mL via INTRAVENOUS

## 2018-01-25 MED ORDER — TECHNETIUM TC 99M DIETHYLENETRIAME-PENTAACETIC ACID
30.0000 | Freq: Once | INTRAVENOUS | Status: AC | PRN
  Administered 2018-01-25: 30 via RESPIRATORY_TRACT

## 2018-01-25 MED ORDER — SODIUM CHLORIDE 0.9 % IV SOLN
1.0000 g | Freq: Once | INTRAVENOUS | Status: AC
  Administered 2018-01-25: 1 g via INTRAVENOUS
  Filled 2018-01-25: qty 10

## 2018-01-25 MED ORDER — ONDANSETRON HCL 4 MG PO TABS: 4.0000 mg | ORAL_TABLET | Freq: Four times a day (QID) | ORAL | Status: DC | PRN

## 2018-01-25 MED ORDER — OXYCODONE HCL 5 MG PO TABS
15.0000 mg | ORAL_TABLET | ORAL | Status: DC | PRN
  Administered 2018-01-25 – 2018-01-26 (×2): 15 mg via ORAL
  Filled 2018-01-25 (×2): qty 3

## 2018-01-25 MED ORDER — IOPAMIDOL (ISOVUE-300) INJECTION 61%
100.0000 mL | Freq: Once | INTRAVENOUS | Status: AC | PRN
  Administered 2018-01-25: 100 mL via INTRAVENOUS

## 2018-01-25 MED ORDER — SODIUM CHLORIDE 0.9 % IV SOLN
1.0000 g | INTRAVENOUS | Status: DC
  Filled 2018-01-25 (×2): qty 10

## 2018-01-25 NOTE — H&P (Signed)
History and Physical  Luis Frey QPY:[id number] DOB: 1970/03/15 DOA: 01/27/2018  Referring physician: Rogene Houston  PCP: Lavella Lemons, [place]   Chief Complaint: generalized weakness   HPI: Luis Frey is a 47 y.o. male presented to the emergency room by EMS with complaints of generalized weakness and abdominal pain.  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  He is status post a Whipple procedure and is currently being followed at the [facility] in [place].  He tends to go every 2 weeks for follow-up.  His first therapeutic agent did not help and his tumor ended up enlarging while he was on it.  He just completed his 6 treatment of a new regimen on November 25.  He has recently received transfusions of packed red blood cells secondary to anemia of neoplastic disease where his hemoglobin came down to 6.8.  Patient has been having more adverse reactions to this secondary round of treatments.  He has been having nausea and vomiting.  He is also been having constipation likely related to the high-dose opioids that he is taking.  He is lost a significant amount of weight in the last several weeks.  He has not been eating well in the past 2 weeks.  Patient has been having increased shortness of breath and dyspnea on exertion over the past several days.  ED course: The patient was evaluated in the ED and noted to look very ill and frail.  A CT of the abdomen was obtained that revealed very large liver metastases.  He was also noted to have a UTI.  His white blood cell count was markedly elevated at 28.  He was very clinically dehydrated and severely malnourished.  He was started on antibiotics for UTI after blood cultures were obtained.  Lactic acid is pending.  The patient is being admitted for further management.  Review of Systems: All systems reviewed and apart from history of presenting illness, are negative.  Past Medical History:  Diagnosis  Date  . Ampullary carcinoma (HCC)    pancreatic   . Anxiety   . Aspiration pneumonia ([place])   . Chronic pain   . GERD (gastroesophageal reflux disease)   . History of kidney stones   . Pancreatic fluid leak 08/2016   s/p whipple   Past Surgical History:  Procedure Laterality Date  . BILIARY STENT PLACEMENT N/A 07/06/2016   Procedure: BILIARY STENT PLACEMENT;  Surgeon: Daneil Dolin, MD;  [facility];  Service: Endoscopy;  Laterality: N/A;  . BIOPSY  07/06/2016   Procedure: BIOPSY ampullary;  Surgeon: Daneil Dolin, MD;  Location: [facility];  Service: Endoscopy;;  . ERCP N/A 07/06/2016   Procedure: ENDOSCOPIC RETROGRADE CHOLANGIOPANCREATOGRAPHY (ERCP);  Surgeon: Daneil Dolin, MD;  Location[facility];  Service: Endoscopy;  Laterality: N/A;  130   . SPHINCTEROTOMY  07/06/2016   Procedure: SPHINCTEROTOMY;  Surgeon: Daneil Dolin, MD;  Location: [facility];  Service: Endoscopy;;  . [place] ARTERY STENT  08/2016   [facility]  . WHIPPLE PROCEDURE  08/08/2016   Dr. Crisoforo Oxford at [facility] History:  reports that he quit smoking about 6 years ago. His smoking use included cigarettes. He has a 24.00 pack-year smoking history. He quit smokeless tobacco use about 35 years ago.  His smokeless tobacco use included snuff. He reports current drug use. Drug: Marijuana. He reports that he does not drink alcohol.  Allergies  Allergen Reactions  . Poison Oak Extract Itching, Swelling and Rash    Family History  Problem Relation Age of Onset  . Liver disease Neg Hx     Prior to Admission medications   Medication Sig Start Date End Date Taking? Authorizing Provider  ALPRAZolam Duanne Moron) 0.25 MG tablet Take 0.25 mg by mouth at bedtime as needed for anxiety.    [provider]  CREON 24000-76000 units CPEP Take 3 capsules by mouth 3 (three) times daily with meals. 10/11/16   [provider]  docusate sodium (COLACE) 100 MG capsule Take  100 mg by mouth 2 (two) times daily as needed for mild constipation.    [provider]  morphine (MS CONTIN) 30 MG 12 hr tablet TAKE ONE TABLET BY MOUTH EVERY 12 HOURS FOR PAIN MANAGEMENT 03/11/17   [provider]  Oxycodone HCl 10 MG TABS Take 10-15 mg by mouth every 4 (four) hours.     [provider]  pantoprazole (PROTONIX) 40 MG tablet Take 40 mg by mouth 2 (two) times daily.    [provider]   Physical Exam: Vitals:   01/28/2018 0830 02/11/2018 0930 01/16/2018 1000 01/15/2018 1118  BP: 105/71 117/77 109/73 107/67  Pulse:    (!) 114  Resp: 17 17 (!) 22 18  Temp:    97.8 F (36.6 C)  TempSrc:    Oral  SpO2:    100%  Weight:      Height:         General exam: Frail appearing male, appears much older than stated age and severely malnourished , lying supine on the gurney in no obvious distress.  He appears to be slowly dying.  Head, eyes and ENT: Nontraumatic and normocephalic. Pupils equally reacting to light and accommodation.  Bilateral scleral icterus.  Oral mucosa dry.  Neck: Supple. No JVD, carotid bruit or thyromegaly.  Lymphatics: No lymphadenopathy.  Respiratory system: Clear to auscultation.  Mild tachypnea.  Cardiovascular system: S1 and S2 heard, tachycardic. No JVD, murmurs, gallops, clicks or pedal edema.  Gastrointestinal system: Abdomen is moderately distended, soft and right upper quadrant tenderness and no guarding. Normal bowel sounds heard.  Marked hepatomegaly.  Central nervous system: Alert and oriented. No focal neurological deficits.  Extremities: Symmetric 5 x 5 power. Peripheral pulses symmetrically felt.   Skin: Pale appearing.  No rashes or acute findings.  Musculoskeletal system: Bilateral lower extremity edema.  Psychiatry: Flat affect.  Labs on Admission:  Basic Metabolic Panel: Recent Labs  Lab 02/10/2018 0609  NA 131*  K 4.4  CL 96*  CO2 22  GLUCOSE 85  BUN 17  CREATININE 0.59*  CALCIUM 8.2*    Liver Function Tests: Recent Labs  Lab 01/14/2018 0609  AST 78*  ALT 25  ALKPHOS 385*  BILITOT 3.8*  PROT 5.5*  ALBUMIN 1.9*   No results for input(s): LIPASE, AMYLASE in the last 168 hours. Recent Labs  Lab 01/27/2018 0609  AMMONIA 38*   CBC: Recent Labs  Lab 01/22/2018 0609  WBC 28.7*  NEUTROABS 23.1*  HGB 7.6*  HCT 25.8*  MCV 90.8  PLT 313   Cardiac Enzymes: Recent Labs  Lab 01/28/2018 0609 01/29/2018 0953  CKTOTAL  --  21*  TROPONINI 0.06* 0.05*    BNP (last 3 results) No results for input(s): PROBNP in the last 8760 hours. CBG: No results for input(s): GLUCAP in the last 168 hours.  Radiological Exams on Admission: Ct Abdomen Pelvis W Contrast  Result Date: 02/09/2018 CLINICAL DATA:  Generalized weakness and abdominal pain. EXAM: CT ABDOMEN AND PELVIS WITH CONTRAST TECHNIQUE: Multidetector CT imaging of the abdomen and pelvis was performed using the standard protocol following bolus administration of intravenous contrast. CONTRAST:  121mL ISOVUE-300 IOPAMIDOL (ISOVUE-300) INJECTION 61% COMPARISON:  CT scan of March 16, 2017. FINDINGS: Lower chest: No acute abnormality. Hepatobiliary: Status post cholecystectomy. No biliary dilatation is noted. There is interval development of large hepatic masses in both hepatic lobes most consistent with metastatic disease. There appears to be complete replacement of normal parenchyma of left hepatic lobe with malignancy. Largest single mass measures 17.8 x 13.3 cm. Pancreas: Status post Whipple's procedure. Pancreatic tail is unremarkable. Multiple enlarged lymph nodes are noted in the surgical bed which are increased compared to prior exam, concerning for malignancy. The largest measures 2.5 cm in size. Superior mesenteric artery stent is occluded. Portal vein appears to be patent. Spleen: Normal in size without focal abnormality. Adrenals/Urinary Tract: Adrenal glands are unremarkable. Nonobstructive left renal calculus is noted. No  hydronephrosis or renal obstruction is noted. Bladder is unremarkable. Stomach/Bowel: The stomach is displaced by the enlarged left hepatic masses. But does not appear to be obstructed. There is no evidence of bowel obstruction or inflammation. The appendix is not clearly visualized. Vascular/Lymphatic: There is no evidence of abdominal aortic aneurysm or dissection. Increased periaortic adenopathy is noted consistent with metastatic disease. This includes 1.4 cm right periaortic lymph node. Reproductive: Prostate is unremarkable. Other: Large periumbilical hernia is noted which contains fluid, but no bowel. Musculoskeletal: No acute or significant osseous findings. IMPRESSION: Interval development of large hepatic masses are noted consistent with metastatic disease secondary to pancreatic cancer, particularly in the left hepatic lobe anteriorly. This is most prominently seen in the left hepatic lobe with the largest single mass measuring 17.8 x 13.3 cm. Status post Whipple's procedure. While the pancreatic body and tail appear normal, there is interval development of multiple enlarged lymph nodes in the region of the pancreatic bed and retroperitoneal region consistent with metastatic disease. Large periumbilical hernia is noted which contains fluid, but no bowel. Electronically Signed   By: Marijo Conception, M.D.   On: 01/19/2018 10:46   Dg Chest Port 1 View  Result Date: 01/29/2018 CLINICAL DATA:  Shortness of breath and weakness. Abdominal pain. Liver cancer. EXAM: PORTABLE CHEST 1 VIEW COMPARISON:  09/01/2017. FINDINGS: Normal heart size. Elevated RIGHT hemidiaphragm. No consolidation or edema. Mild vascular congestion. IMPRESSION: Elevated RIGHT hemidiaphragm could reflect hepatic disease. No active cardiopulmonary disease. Electronically Signed   By: Staci Righter M.D.   On: 01/28/2018 07:13   Assessment/Plan Active Problems:   Ampullary carcinoma (HCC)   Chronic pain   Protein-calorie malnutrition,  severe   UTI (urinary tract infection)   Generalized weakness   Anemia in neoplastic disease   Liver metastases (HCC)   Large Liver masses   GERD (gastroesophageal reflux disease)   Dehydration   1. 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  His CT scan is consistent with large liver metastases.  We do not have any recent CT scans to compare as he has been receiving scans and treatments at the cancer centers of [place] in [place].  The patient will be started on supportive therapy.  I believe that goals of care discussions are in order.  I would like for the patient to meet with the palliative medicine team on Monday when they are available in the hospital.  At this time, patient says he wants to continue fighting.  He does not want hospice but does not want to die in the  Hospital.  I had a long discussion with patient and wife,  He wants to remain full code at this time.  I will give them time to process this and re-address code status at a later time.   2. UTI-urine culture ordered and will follow results, continue IV ceftriaxone for 3 doses.  Follow blood cultures. 3. Shortness of breath-patient has tachypnea, sinus tachycardia associated with dyspnea he is at high risk for pulmonary embolus-we will obtain a CTA of the chest (CT unable to be done due to recent contrast administration - VQ scan ordered). 4. Anemia neoplastic disease- hemoglobin is low at 7.4 however suspect will need to transfuse after hydration.  Type and screen ordered.  Likely will need to transfuse in a.m. 5. Elevated lactic acid - likely elevated in setting of severe dehydration, metastatic cancer and infection.  IV fluid boluses and continuous IV fluids ordered.  6. Dehydration-IV fluid hydration has been ordered. 7. GERD-we will order Protonix for GI protection. 8. Chronic constipation-opioid induced- continue laxatives as ordered. 9. Chronic pain related to  metastatic cancer- resume home pain medication regimen with IV Dilaudid ordered for breakthrough pain and severe uncontrolled pain symptoms. 10. Severe protein calorie malnutrition-patient has not been eating well in the last 2 weeks will ask for dietitian to assist with supplements while in the hospital.   DVT Prophylaxis: Lovenox Code Status: Full Family Communication: Wife Disposition Plan: To be determined  Time spent: 57 minutes  Irwin Brakeman, MD [facility] Pager [phone]  If 7PM-7AM, please contact night-coverage [email or web address] Password [facility] 01/15/2018, 11:32 AM

## 2018-01-25 NOTE — ED Triage Notes (Signed)
Pt in by [facility] from home with generalized weakness, abd pain for several days.  Stage 4 liver cancer

## 2018-01-25 NOTE — ED Notes (Signed)
CRITICAL VALUE ALERT  Critical Value:  Troponin 0.06  Date & Time Notied:  0725   Provider Notified: Dr Tomi Bamberger  Orders Received/Actions taken: no new orders

## 2018-01-25 NOTE — ED Provider Notes (Signed)
CT scan shows very large liver masses.  Secondary to metastatic pancreatic cancer most likely.  Also urine consistent with urinary tract infection.  Urine cultured lactic acid and blood cultures are pending CK was normal.  Patient treated with Rocephin for the urinary tract infection.  Also her hemoglobin was below 8 in the 7 range.  That may go down further with additional hydration and patient may require blood transfusion.  Discussed with hospitalist they will admit.   Fredia Sorrow, MD 01/30/2018 1113

## 2018-01-25 NOTE — ED Notes (Signed)
Patient transported to CT 

## 2018-01-25 NOTE — ED Provider Notes (Signed)
[facility] EMERGENCY DEPARTMENT Provider Note   CSN: [id number] Arrival date & time: 01/19/2018  0458  Time seen 5:00 AM   History   Chief Complaint Chief Complaint  Patient presents with  . Weakness    HPI Luis Frey is a 47 y.o. male.  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  He is currently being followed at the [facility] [place] in [place].  He goes about every 2 weeks.  Wife states the first chemotherapy agent did not help and his tumor got bigger while on it.  He just finished his 6th  treatment of a new regimen on November 25.  Wife states he was on ? Abraxane and Gemzar.  She states before this last treatment he was anemic with his hemoglobin down to 6.8 and got 2 units of blood so he get his chemotherapy.  It was all done on the same day.  She states normally has low-grade fevers after the chemotherapy treatments.  She states this treatment seemed to hit him harder than the others.  He has been having nausea and vomiting and the wife states that Zofran helps but he did not have any vomiting today.  He has not had diarrhea but feels like he is constipated.  He feels like his abdomen is getting more bloated.  Wife states he has lost weight, his weight on November 25 was 192 pounds.  She states he has not been able to eat at all for the past 2 weeks.  She also noticed that he started getting swelling of his ankles shortly after the last chemotherapy treatment.  He has had a cough with white mucus without fever.  Wife states he has been getting progressively more short of breath and having dyspnea on exertion which seemed to get worse tonight and that is what prompted their ED visit.  PCP Lavella Lemons, PA   Past Medical History:  Diagnosis Date  . Ampullary carcinoma (HCC)    pancreatic   . Anxiety   . Aspiration pneumonia ([place])   . Chronic pain   . GERD (gastroesophageal reflux disease)   . History of kidney stones     . Pancreatic fluid leak 08/2016   s/p whipple    Patient Active Problem List   Diagnosis Date Noted  . Aspiration into airway 10/29/2016  . Sepsis ([place]) 10/29/2016  . Ampullary carcinoma ([place]) 10/29/2016  . Chronic pain 10/29/2016  . Protein-calorie malnutrition, severe 10/29/2016  . Dilated bile duct 07/05/2016  . Biliary obstruction 07/05/2016  . Abnormal LFTs 03/21/2016  . Anemia 03/21/2016  . Melena 03/21/2016    Past Surgical History:  Procedure Laterality Date  . BILIARY STENT PLACEMENT N/A 07/06/2016   Procedure: BILIARY STENT PLACEMENT;  Surgeon: Daneil Dolin, MD;  Location: [facility];  Service: Endoscopy;  Laterality: N/A;  . BIOPSY  07/06/2016   Procedure: BIOPSY ampullary;  Surgeon: Daneil Dolin, MD;  Location: [facility];  Service: Endoscopy;;  . ERCP N/A 07/06/2016   Procedure: ENDOSCOPIC RETROGRADE CHOLANGIOPANCREATOGRAPHY (ERCP);  Surgeon: Daneil Dolin, MD;  Location: [facility] SU[facility];  Service: Endoscopy;  Laterality: N/A;  130   . SPHINCTEROTOMY  07/06/2016   Procedure: SPHINCTEROTOMY;  Surgeon: Daneil Dolin, MD;  Location: [facility];  Service: Endoscopy;;  . [place] ARTERY STENT  08/2016   [facility]  . WHIPPLE PROCEDURE  08/08/2016   Dr. Crisoforo Oxford at [facility]  Home Medications    Prior to Admission medications   Medication Sig Start Date End Date Taking? Authorizing Provider  ALPRAZolam Duanne Moron) 0.25 MG tablet Take 0.25 mg by mouth at bedtime as needed for anxiety.    [provider]  CREON 24000-76000 units CPEP Take 3 capsules by mouth 3 (three) times daily with meals. 10/11/16   [provider]  docusate sodium (COLACE) 100 MG capsule Take 100 mg by mouth 2 (two) times daily as needed for mild constipation.    [provider]  morphine (MS CONTIN) 30 MG 12 hr tablet TAKE ONE TABLET BY MOUTH EVERY 12 HOURS FOR PAIN MANAGEMENT 03/11/17   [provider]  Oxycodone HCl 10 MG TABS  Take 10-15 mg by mouth every 4 (four) hours.     [provider]  pantoprazole (PROTONIX) 40 MG tablet Take 40 mg by mouth 2 (two) times daily.    [provider]    Family History Family History  Problem Relation Age of Onset  . Liver disease Neg Hx     Social History Social History   Tobacco Use  . Smoking status: Former Smoker    Packs/day: 1.00    Years: 24.00    Pack years: 24.00    Types: Cigarettes    Last attempt to quit: 07/06/2011    Years since quitting: 6.5  . Smokeless tobacco: Former Systems developer    Types: Snuff    Quit date: 07/06/1982  Substance Use Topics  . Alcohol use: No    Comment: socially , moonshine once every 3 months  . Drug use: Yes    Types: Marijuana  lives with wife   Allergies   Poison oak extract   Review of Systems Review of Systems  All other systems reviewed and are negative.    Physical Exam Updated Vital Signs BP 111/80 (BP Location: Left Arm)   Pulse (!) 133   Temp 98.7 F (37.1 C) (Oral)   Resp 16   Ht 5\' 11"  (1.803 m)   Wt 86.2 kg   SpO2 96%   BMI 26.50 kg/m   Vital signs normal except for tachycardia  Physical Exam Vitals signs and nursing note reviewed.  Constitutional:      General: He is not in acute distress.    Appearance: Normal appearance. He is well-developed. He is ill-appearing. He is not toxic-appearing.     Comments: Muscle wasting very prominent in his temples  HENT:     Head: Normocephalic and atraumatic.     Right Ear: External ear normal.     Left Ear: External ear normal.     Nose: Nose normal. No mucosal edema or rhinorrhea.     Mouth/Throat:     Dentition: No dental abscesses.     Pharynx: No uvula swelling.  Eyes:     General: Scleral icterus present.     Conjunctiva/sclera: Conjunctivae normal.     Pupils: Pupils are equal, round, and reactive to light.  Neck:     Musculoskeletal: Full passive range of motion without pain, normal range of motion and neck supple.   Cardiovascular:     Rate and Rhythm: Regular rhythm. Tachycardia present.     Heart sounds: Normal heart sounds. No murmur. No friction rub. No gallop.   Pulmonary:     Effort: Tachypnea present. No respiratory distress.     Breath sounds: Normal breath sounds. No decreased air movement. No wheezing, rhonchi or rales.  Chest:  Chest wall: No tenderness or crepitus.  Abdominal:     General: Bowel sounds are normal. There is distension.     Palpations: Abdomen is soft. There is hepatomegaly.     Tenderness: There is abdominal tenderness in the right upper quadrant. There is no guarding or rebound.  Musculoskeletal: Normal range of motion.        General: Swelling present. No tenderness.     Comments: Moves all extremities well.  Extremities look swollen below his knees however there is no pitting noted  Skin:    General: Skin is warm and dry.     Coloration: Skin is pale.     Findings: No erythema or rash.  Neurological:     Mental Status: He is alert and oriented to person, place, and time.     Cranial Nerves: No cranial nerve deficit.  Psychiatric:        Mood and Affect: Mood normal. Mood is not anxious.        Speech: Speech normal.        Behavior: Behavior normal.     Comments: Flat affect      ED Treatments / Results  Labs (all labs ordered are listed, but only abnormal results are displayed) Results for orders placed or performed during the hospital encounter of 01/31/2018  Comprehensive metabolic panel  Result Value Ref Range   Sodium 131 (L) 135 - 145 mmol/L   Potassium 4.4 3.5 - 5.1 mmol/L   Chloride 96 (L) 98 - 111 mmol/L   CO2 22 22 - 32 mmol/L   Glucose, Bld 85 70 - 99 mg/dL   BUN 17 6 - 20 mg/dL   Creatinine, Ser 0.59 (L) 0.61 - 1.24 mg/dL   Calcium 8.2 (L) 8.9 - 10.3 mg/dL   Total Protein 5.5 (L) 6.5 - 8.1 g/dL   Albumin 1.9 (L) 3.5 - 5.0 g/dL   AST 78 (H) 15 - 41 U/L   ALT 25 0 - 44 U/L   Alkaline Phosphatase 385 (H) 38 - 126 U/L   Total Bilirubin  3.8 (H) 0.3 - 1.2 mg/dL   GFR calc non Af Amer >60 >60 mL/min   GFR calc Af Amer >60 >60 mL/min   Anion gap 13 5 - 15  CBC with Differential  Result Value Ref Range   WBC 28.7 (H) 4.0 - 10.5 K/uL   RBC 2.84 (L) 4.22 - 5.81 MIL/uL   Hemoglobin 7.6 (L) 13.0 - 17.0 g/dL   HCT 25.8 (L) 39.0 - 52.0 %   MCV 90.8 80.0 - 100.0 fL   MCH 26.8 26.0 - 34.0 pg   MCHC 29.5 (L) 30.0 - 36.0 g/dL   RDW 27.4 (H) 11.5 - 15.5 %   Platelets 313 150 - 400 K/uL   nRBC 0.5 (H) 0.0 - 0.2 %   Neutrophils Relative % 80 %   Neutro Abs 23.1 (H) 1.7 - 7.7 K/uL   Lymphocytes Relative 7 %   Lymphs Abs 1.9 0.7 - 4.0 K/uL   Monocytes Relative 7 %   Monocytes Absolute 1.9 (H) 0.1 - 1.0 K/uL   Eosinophils Relative 0 %   Eosinophils Absolute 0.0 0.0 - 0.5 K/uL   Basophils Relative 0 %   Basophils Absolute 0.1 0.0 - 0.1 K/uL   RBC Morphology RARE NRBC PRESENT   ANISOCYTOSIS PRESENT    Immature Granulocytes 6 %   Abs Immature Granulocytes 1.84 (H) 0.00 - 0.07 K/uL   Polychromasia PRESENT   Protime-INR  Result Value Ref Range   Prothrombin Time 23.7 (H) 11.4 - 15.2 seconds   INR 2.15   APTT  Result Value Ref Range   aPTT 39 (H) 24 - 36 seconds  Ammonia  Result Value Ref Range   Ammonia 38 (H) 9 - 35 umol/L  Troponin I - Once  Result Value Ref Range   Troponin I 0.06 (HH) <0.03 ng/mL   Laboratory interpretation all normal except elevated troponin, minimal elevation of ammonia. Mild prolongation of PTT and INR is in therapeutic range if he were on coumadin, anemia, leukocytosis, elevation of LFTs, malnutrition    EKG EKG Interpretation  Date/Time:  Saturday January 25 2018 05:03:14 EST Ventricular Rate:  133 PR Interval:    QRS Duration: 93 QT Interval:  307 QTC Calculation: 457 R Axis:   74 Text Interpretation:  Sinus tachycardia Atrial premature complex Nonspecific T abnormalities, inferior leads No significant change since last tracing 16 Mar 2017 Confirmed by Rolland Porter [phone]) on 01/22/2018  5:20:22 AM   Radiology Dg Chest Port 1 View  Result Date: 02/02/2018 CLINICAL DATA:  Shortness of breath and weakness. Abdominal pain. Liver cancer. EXAM: PORTABLE CHEST 1 VIEW COMPARISON:  09/01/2017. FINDINGS: Normal heart size. Elevated RIGHT hemidiaphragm. No consolidation or edema. Mild vascular congestion. IMPRESSION: Elevated RIGHT hemidiaphragm could reflect hepatic disease. No active cardiopulmonary disease. Electronically Signed   By: Staci Righter M.D.   On: 02/10/2018 07:13    Procedures Procedures (including critical care time)  Medications Ordered in ED Medications  sodium chloride 0.9 % bolus 1,000 mL (1,000 mLs Intravenous New Bag/Given 01/31/2018 0806)  sodium chloride 0.9 % bolus 1,000 mL (0 mLs Intravenous Stopped 02/03/2018 0744)  oxyCODONE (Oxy IR/ROXICODONE) immediate release tablet 20 mg (20 mg Oral Given 02/04/2018 0747)     Initial Impression / Assessment and Plan / ED Course  I have reviewed the triage vital signs and the nursing notes.  Pertinent labs & imaging results that were available during my care of the patient were reviewed by me and considered in my medical decision making (see chart for details).  Laboratory testing was done, patient was requesting to drink and he was allowed to drink fluids since he has not had any vomiting today, December 13.    Patient was given a liter of fluid for his tachycardia and presumed dehydration.  Patient states that his abdominal pain is chronic and not worse.  He actually said when I palpated his abdomen it made it feel better.  Wife shows me his blood work from [place] and his white count was 17,000 prior to getting chemotherapy.  She denies that he gets Epogen that she is aware to have afterwards.  Patient is starting to withdrawal and is hurting all over from not having his narcotics and is requesting his pain medication.  He denies any nausea and states he can take his regular dose.  He was  given OxyContin 20 mg orally.  I talked to the patient about admission, he is reluctant because they have a hotel reservation in [place] today and he is scheduled to have a CT scan done tomorrow and he has a pain management appointment he cannot miss on Monday, the 16th.  He does not look like he feels well enough to travel 6 hours in a car to [place] today.  I talked to him about admission but he is going to discuss with his wife and decide if he wants to stay or try  to go to [place] today.  Patient has not had any urinary output yet, he was given a second liter of normal saline.  Recheck at 8:15 AM patient states his wife is trying to contact their care manager at cancer treatment centers in [place].  Patient states he feels like he should stay but his wife wants to talk to his doctors in [place].  CT of abdomen was not done since he is going to his own center and is scheduled to have one tomorrow, however if he stays CT can be done here, unfortunately we do not have any recent CTs to compare to.  I sorted out spontaneous bacterial peritonitis because his abdomen felt better when I palpated it, he has no known fever.  His white blood cell count was high before the last chemotherapy at 17,000 and now it is very high at 28,000.  He again denies getting Epogen or any other leukocyte stimulating drug.  Patient did have some urine in the urinal at the bedside and it is very dark brown/orange.  It was sent to the lab.  08:20 AM Dr Rogene Houston, made aware that patient is deciding what he is going to do.   Care manager at [facility] [place], [phone], his doctor is Dr. Kaleen Mask  Final Clinical Impressions(s) / ED Diagnoses   Final diagnoses:  Weakness  Shortness of breath    Disposition pending  Rolland Porter, MD, Barbette Or, MD 01/18/2018 0830

## 2018-01-25 NOTE — Progress Notes (Addendum)
Patient arrived to floor via stretcher. Patient tranfers to bed safely. Wife is at bedside. Dr. Wynetta Emery is at bedside. Will return once MD is finished.

## 2018-01-25 NOTE — Progress Notes (Signed)
Initial Nutrition Assessment  DOCUMENTATION CODES:  Not applicable -high suspicion for malnutrition, but unable to dx w/o seeing in person.   INTERVENTION:  Boost Breeze po TID, each supplement provides 250 kcal and 9 grams of protein  Will order 30 mL Prostat TID, each supplement provides 100 kcal and 15 grams of protein.  Will f/u when back on site to assist with diet optimization  NUTRITION DIAGNOSIS:  Inadequate oral intake related to cancer and cancer related treatments, nausea, vomiting, poor appetite, constipation as evidenced by per patient/family report.  GOAL:  Patient will meet greater than or equal to 90% of their needs  MONITOR:  PO intake, Supplement acceptance, Labs, I & O's, Weight trends  REASON FOR ASSESSMENT:  Consult Assessment of nutrition requirement/status  ASSESSMENT:  46 y/o male PMHx of ampullary carcinoma 06/2016 s/p whipple 6//27/2018. In April of this year, liver lesion biopsy positive for metastatic disease. Has been receiving chemo at [facility] [place] in [place]. Presented to ED d/t worsening weakness, SOB following chemo. CT of liver in ED showed large liver masses. Admitted for management. RD consulted d/t report of weight loss and very poor appetite.   RD operating remotely.  Per ED note, pt has "not been able to eat at all for the past 2 weeks". Also noted to have nausea and vomiting. No reported diarrhea. Has actually had constipation. He takes creon.  His CT showed large masses of both hepatic lobes, w/ the left lobe completely replaced with malignancy. This is likely cause of n/v and poor appetite.   He does not appear to have been weighed at [facility] yet, but pt spouse reported pt was 192 lbs in November.  Per chart, his wt at initial consult for ampullary cancer on 07/17/2016 was 263 lbs. At that time, he reportedly already lost 40 lbs since Jan 2018, placing his UBW ~300 lbs. This is given credence with a sole encounter from 2016  showing a wt of 296 lbs. Following whipple in June 2018, continued to lose weight. Was 176 lbs in Feb 2019, but appears to have gained some wt back; his last wt available in Care Everywhere was 214.5. THis was taken in May of this year.    Pt will likely do best with clear liquids given his nausea and pancreatic exocrine insufficiency. Will order boost breeze and prostat. Will follow up next week to try to assist with diet optimization.   Labs:  Albumin: 1.9, WBC: 28.7, H/H:7.6/25.8, Na: 131,  Meds: Senna-docusate, IVF, IV abx,   Recent Labs  Lab 02/05/2018 0609  NA 131*  K 4.4  CL 96*  CO2 22  BUN 17  CREATININE 0.59*  CALCIUM 8.2*  GLUCOSE 85   NUTRITION - FOCUSED PHYSICAL EXAM: Unable to perform  Diet Order:   Diet Order            Diet regular Room service appropriate? Yes; Fluid consistency: Thin  Diet effective now             EDUCATION NEEDS:  No education needs have been identified at this time  Skin:  Skin Assessment: Reviewed RN Assessment  Last BM:  Unknown  Height:  Ht Readings from Last 1 Encounters:  02/05/2018 5\' 11"  (1.803 m)   Weight:  Wt Readings from Last 1 Encounters:  01/22/2018 86.2 kg   Wt Readings from Last 10 Encounters:  01/22/2018 86.2 kg  03/27/17 79.8 kg  03/16/17 90.7 kg  12/08/16 90.7 kg  10/29/16 94.7  kg  07/06/16 117.9 kg  07/05/16 117.9 kg  07/05/16 118.1 kg  03/21/16 119.4 kg   Ideal Body Weight:  78.18 kg  BMI:  Body mass index is 26.5 kg/m.  Estimated Nutritional Needs:  Kcal:  2250-2450 kcals (30 kcal/kg ibw +/- 100) Protein:  117-133 g Pro (1.5-1.7g/kg ibw) Fluid:  Per MD goals  Burtis Junes RD, LDN, CNSC Clinical Nutrition Available Tues-Sat via Pager: [phone] 02/05/2018 3:58 PM

## 2018-01-25 NOTE — ED Notes (Signed)
First set of blood cultures obtained from port

## 2018-01-25 NOTE — Progress Notes (Signed)
CRITICAL VALUE ALERT  Critical Value:  Lactic Acid 3.2  Date & Time Notied:  01/28/2018 1200  Provider Notified: Wynetta Emery  Orders Received/Actions taken: MD paged

## 2018-01-26 DIAGNOSIS — K7200 Acute and subacute hepatic failure without coma: Secondary | ICD-10-CM | POA: Diagnosis present

## 2018-01-26 DIAGNOSIS — N390 Urinary tract infection, site not specified: Secondary | ICD-10-CM | POA: Diagnosis present

## 2018-01-26 DIAGNOSIS — I2699 Other pulmonary embolism without acute cor pulmonale: Secondary | ICD-10-CM | POA: Diagnosis present

## 2018-01-26 DIAGNOSIS — B9689 Other specified bacterial agents as the cause of diseases classified elsewhere: Secondary | ICD-10-CM | POA: Diagnosis present

## 2018-01-26 DIAGNOSIS — R7881 Bacteremia: Secondary | ICD-10-CM | POA: Diagnosis present

## 2018-01-26 DIAGNOSIS — D72829 Elevated white blood cell count, unspecified: Secondary | ICD-10-CM | POA: Diagnosis present

## 2018-01-26 DIAGNOSIS — B961 Klebsiella pneumoniae [K. pneumoniae] as the cause of diseases classified elsewhere: Secondary | ICD-10-CM

## 2018-01-26 DIAGNOSIS — R791 Abnormal coagulation profile: Secondary | ICD-10-CM | POA: Diagnosis present

## 2018-01-26 LAB — BLOOD CULTURE ID PANEL (REFLEXED)
Acinetobacter baumannii: NOT DETECTED
Candida albicans: NOT DETECTED
Candida glabrata: NOT DETECTED
Candida krusei: NOT DETECTED
Candida parapsilosis: NOT DETECTED
Candida tropicalis: NOT DETECTED
Carbapenem resistance: NOT DETECTED
ENTEROCOCCUS SPECIES: NOT DETECTED
Enterobacter cloacae complex: NOT DETECTED
Enterobacteriaceae species: DETECTED — AB
Escherichia coli: NOT DETECTED
Haemophilus influenzae: NOT DETECTED
Klebsiella oxytoca: NOT DETECTED
Klebsiella pneumoniae: DETECTED — AB
LISTERIA MONOCYTOGENES: NOT DETECTED
NEISSERIA MENINGITIDIS: NOT DETECTED
Proteus species: NOT DETECTED
Pseudomonas aeruginosa: NOT DETECTED
STREPTOCOCCUS AGALACTIAE: NOT DETECTED
STREPTOCOCCUS PNEUMONIAE: NOT DETECTED
STREPTOCOCCUS SPECIES: NOT DETECTED
Serratia marcescens: NOT DETECTED
Staphylococcus aureus (BCID): NOT DETECTED
Staphylococcus species: NOT DETECTED
Streptococcus pyogenes: NOT DETECTED

## 2018-01-26 LAB — PREPARE RBC (CROSSMATCH)

## 2018-01-26 LAB — CBC WITH DIFFERENTIAL/PLATELET
Abs Immature Granulocytes: 1.44 10*3/uL — ABNORMAL HIGH (ref 0.00–0.07)
Basophils Absolute: 0.1 10*3/uL (ref 0.0–0.1)
Basophils Relative: 0 %
Eosinophils Absolute: 0.1 10*3/uL (ref 0.0–0.5)
Eosinophils Relative: 0 %
HCT: 22.4 % — ABNORMAL LOW (ref 39.0–52.0)
Hemoglobin: 6.4 g/dL — CL (ref 13.0–17.0)
Immature Granulocytes: 6 %
Lymphocytes Relative: 7 %
Lymphs Abs: 1.6 10*3/uL (ref 0.7–4.0)
MCH: 26.4 pg (ref 26.0–34.0)
MCHC: 28.6 g/dL — ABNORMAL LOW (ref 30.0–36.0)
MCV: 92.6 fL (ref 80.0–100.0)
MONOS PCT: 6 %
Monocytes Absolute: 1.5 10*3/uL — ABNORMAL HIGH (ref 0.1–1.0)
Neutro Abs: 19.6 10*3/uL — ABNORMAL HIGH (ref 1.7–7.7)
Neutrophils Relative %: 81 %
Platelets: 242 10*3/uL (ref 150–400)
RBC: 2.42 MIL/uL — ABNORMAL LOW (ref 4.22–5.81)
RDW: 27.9 % — ABNORMAL HIGH (ref 11.5–15.5)
WBC: 24.2 10*3/uL — ABNORMAL HIGH (ref 4.0–10.5)
nRBC: 0.2 % (ref 0.0–0.2)

## 2018-01-26 LAB — COMPREHENSIVE METABOLIC PANEL
ALK PHOS: 353 U/L — AB (ref 38–126)
ALT: 24 U/L (ref 0–44)
AST: 83 U/L — ABNORMAL HIGH (ref 15–41)
Albumin: 1.7 g/dL — ABNORMAL LOW (ref 3.5–5.0)
Anion gap: 7 (ref 5–15)
BUN: 13 mg/dL (ref 6–20)
CO2: 21 mmol/L — ABNORMAL LOW (ref 22–32)
Calcium: 7.6 mg/dL — ABNORMAL LOW (ref 8.9–10.3)
Chloride: 103 mmol/L (ref 98–111)
Creatinine, Ser: 0.54 mg/dL — ABNORMAL LOW (ref 0.61–1.24)
GFR calc Af Amer: >60 mL/min (ref >60)
GFR calc non Af Amer: >60 mL/min (ref >60)
GLUCOSE: 124 mg/dL — AB (ref 70–99)
Potassium: 3.7 mmol/L (ref 3.5–5.1)
Sodium: 131 mmol/L — ABNORMAL LOW (ref 135–145)
TOTAL PROTEIN: 4.8 g/dL — AB (ref 6.5–8.1)
Total Bilirubin: 2.8 mg/dL — ABNORMAL HIGH (ref 0.3–1.2)

## 2018-01-26 LAB — MAGNESIUM: Magnesium: 1.6 mg/dL — ABNORMAL LOW (ref 1.7–2.4)

## 2018-01-26 LAB — PROTIME-INR
INR: 3.14
Prothrombin Time: 31.8 seconds — ABNORMAL HIGH (ref 11.4–15.2)

## 2018-01-26 MED ORDER — HYDROMORPHONE HCL 1 MG/ML IJ SOLN
1.0000 mg | INTRAMUSCULAR | Status: DC | PRN
  Administered 2018-01-26 (×3): 1 mg via INTRAVENOUS
  Filled 2018-01-26 (×3): qty 1

## 2018-01-26 MED ORDER — MAGNESIUM SULFATE 2 GM/50ML IV SOLN
2.0000 g | Freq: Once | INTRAVENOUS | Status: AC
  Administered 2018-01-26: 2 g via INTRAVENOUS
  Filled 2018-01-26: qty 50

## 2018-01-26 MED ORDER — SODIUM CHLORIDE 0.9% IV SOLUTION
Freq: Once | INTRAVENOUS | Status: AC
  Administered 2018-01-26: 18:00:00 via INTRAVENOUS

## 2018-01-26 MED ORDER — SODIUM CHLORIDE 0.9 % IV SOLN
2.0000 g | INTRAVENOUS | Status: AC
  Administered 2018-01-26 – 2018-01-30 (×5): 2 g via INTRAVENOUS
  Filled 2018-01-26 (×5): qty 2

## 2018-01-26 MED ORDER — OXYCODONE HCL 5 MG PO TABS
20.0000 mg | ORAL_TABLET | ORAL | Status: DC
  Administered 2018-01-26 – 2018-01-30 (×26): 20 mg via ORAL
  Filled 2018-01-26 (×26): qty 4

## 2018-01-26 MED ORDER — MORPHINE SULFATE ER 30 MG PO TBCR
30.0000 mg | EXTENDED_RELEASE_TABLET | Freq: Two times a day (BID) | ORAL | Status: DC
  Administered 2018-01-26 – 2018-01-30 (×9): 30 mg via ORAL
  Filled 2018-01-26 (×9): qty 1

## 2018-01-26 MED ORDER — OXYCODONE HCL 5 MG PO TABS: 20.0000 mg | ORAL_TABLET | ORAL | Status: DC | PRN

## 2018-01-26 MED ORDER — OXYCODONE HCL 5 MG PO TABS: 20.0000 mg | ORAL_TABLET | ORAL | Status: DC

## 2018-01-26 NOTE — Progress Notes (Signed)
PROGRESS NOTE  Luis Frey  PQZ:[id number]  DOB: 1970-08-06  DOA: 01/18/2018 PCP: Lavella Lemons, PA   Brief Admission Hx: 47 year old male presented to emergency room with generalized weakness and abdominal pain.  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  He was noted to have worsening of the liver metastasis and evidence of pancreatic metastasis on admission.  He had been treated at the cancer center [facility] [place] in [place].  He was admitted with a UTI.  MDM/Assessment & Plan:   1. 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  His CT scan is consistent with large liver metastases.  We do not have any recent CT scans to compare as he has been receiving scans and treatments at the [facility] centers of [place] in [place] but wife confirms that tumors are much larger now than in September.  The patient was started on supportive therapy.  I began goals of care discussions and patient wants to continue full scope treatment and pain management for the time being.  I would like for the patient to meet with the palliative medicine team on Monday when they are available in the hospital.    He does not want hospice but also says he does not want to die in the hospital. He wants to remain full code at this time.  He seems to be in denial of how seriously ill he is at this time.  Will give family more time to process.  Separate meeting with wife.  2. UTI-urine culture ordered and will follow results, continue IV ceftriaxone for 3 doses.  Follow blood cultures. 3. Gram negative rod bacteremia - increased ceftriaxone dose to 2 gm IV.  Follow C&S.  4. Presumed PE - V/Q scan along with clinical presentation is Highly suggestive of PE. The patient is in active liver failure and his INR>3 so no further anticoagulation ordered.  Continue supportive care.   5. Shortness of  breath-patient likely has a PE. Continue supplemental oxygen for comfort.  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  Transfuse 2 unit PRBC. Follow.  7. Elevated lactic acid - likely elevated in setting of severe dehydration, metastatic cancer and infection.  IV fluid boluses and continuous IV fluids ordered.  8. Dehydration-Continue IV fluid hydration. 9. GERD-Protonix for GI protection. 10. Chronic constipation-opioid induced- continue laxatives as ordered. 11. Chronic pain related to metastatic cancer- resume home pain medication regimen with IV Dilaudid ordered for breakthrough pain and severe uncontrolled pain symptoms.  Obtain palliative medicine consult.  12. Severe protein calorie malnutrition-patient has not been eating well in the last 2 weeks will ask for dietitian to assist with supplements while in the hospital.  DVT Prophylaxis: Lovenox DC'd due to INR>3 Code Status: Full Family Communication: Wife, I spoke with her at length and explained that patient is terminally ill with the beginnings of multiorgan failure and that they should seriously consider full comfort care/hospice services to avoid prolonged suffering.   Disposition Plan: To be determined Consultants:  Palliative medicine  Antimicrobials:  Ceftriaxone 12/14 >   Subjective: Pt says that pain is not controlled.    Objective: Vitals:   01/29/2018 2148 01/26/18 0100 01/26/18 0434 01/26/18 0808  BP: 116/75  97/65 (!) 103/53  Pulse: (!) 117  (!) 112 (!) 102  Resp: 18  18   Temp:   97.6 F (36.4 C) (!) 97.5  F (36.4 C)  TempSrc:   Oral Oral  SpO2: 100% 100% 100% 100%  Weight:      Height:        Intake/Output Summary (Last 24 hours) at 01/26/2018 1093 Last data filed at 01/26/2018 0400 Gross per 24 hour  Intake 2881.6 ml  Output 500 ml  Net 2381.6 ml   Filed Weights   01/24/2018 0501  Weight: 86.2 kg   REVIEW OF SYSTEMS  As per history otherwise all reviewed and reported  negative  Exam:  General exam: appears terminally ill.  He is awake, he is in no acute distress.  Respiratory system: shallow breathing bilateral. No increased work of breathing. Cardiovascular system: normal S1 & S2 heard. No JVD, murmurs, gallops, clicks or pedal edema. Gastrointestinal system: Abdomen is distended, tender with hepatomegaly. Normal bowel sounds heard. Central nervous system: Alert and oriented. No focal neurological deficits. Extremities: no cyanosis or clubbing.  Data Reviewed: Basic Metabolic Panel: Recent Labs  Lab 02/11/2018 0609 01/26/18 0732  NA 131* 131*  K 4.4 3.7  CL 96* 103  CO2 22 21*  GLUCOSE 85 124*  BUN 17 13  CREATININE 0.59* 0.54*  CALCIUM 8.2* 7.6*  MG  --  1.6*   Liver Function Tests: Recent Labs  Lab 01/26/2018 0609 01/26/18 0732  AST 78* 83*  ALT 25 24  ALKPHOS 385* 353*  BILITOT 3.8* 2.8*  PROT 5.5* 4.8*  ALBUMIN 1.9* 1.7*   No results for input(s): LIPASE, AMYLASE in the last 168 hours. Recent Labs  Lab 02/11/2018 0609  AMMONIA 38*   CBC: Recent Labs  Lab 01/24/2018 0609 01/26/18 0838  WBC 28.7* 24.2*  NEUTROABS 23.1* 19.6*  HGB 7.6* 6.4*  HCT 25.8* 22.4*  MCV 90.8 92.6  PLT 313 242   Cardiac Enzymes: Recent Labs  Lab 01/23/2018 0609 01/19/2018 0953  CKTOTAL  --  21*  TROPONINI 0.06* 0.05*   CBG (last 3)  No results for input(s): GLUCAP in the last 72 hours. Recent Results (from the past 240 hour(s))  Culture, blood (Routine X 2) w Reflex to ID Panel     Status: None (Preliminary result)   Collection Time: 01/31/2018 11:14 AM  Result Value Ref Range Status   Specimen Description   Final    PORTA CATH Performed at [facility], [address], [place], [place] [address]    Special Requests   Final    BOTTLES DRAWN AEROBIC AND ANAEROBIC Blood Culture adequate volume Performed at [facility], [address], [place], [place] [address]    Culture  Setup Time   Final    GRAM NEGATIVE RODS Gram Stain Report Called  to,Read Back By and Verified With: THOMAS,K. AT 0107 ON 01/26/2018 BY EVA IN BOTH AEROBIC AND ANAEROBIC BOTTLES Performed at [place], READ BACK BY AND VERIFIED WITH: RN Carlye Grippe [phone] [place] Performed at [facility], [place] [address], [place], [place] [address]    Culture GRAM NEGATIVE RODS  Final   Report Status PENDING  Incomplete  Blood Culture ID Panel (Reflexed)     Status: Abnormal   Collection Time: 01/14/2018 11:14 AM  Result Value Ref Range Status   Enterococcus species NOT DETECTED NOT DETECTED Final   Listeria monocytogenes NOT DETECTED NOT DETECTED Final   Staphylococcus species NOT DETECTED NOT DETECTED Final   Staphylococcus aureus (BCID) NOT DETECTED NOT DETECTED Final   Streptococcus species NOT DETECTED NOT DETECTED Final   Streptococcus  agalactiae NOT DETECTED NOT DETECTED Final   Streptococcus pneumoniae NOT DETECTED NOT DETECTED Final   Streptococcus pyogenes NOT DETECTED NOT DETECTED Final   Acinetobacter baumannii NOT DETECTED NOT DETECTED Final   Enterobacteriaceae species DETECTED (A) NOT DETECTED Final    Comment: Enterobacteriaceae represent a large family of gram-negative bacteria, not a single organism. CRITICAL RESULT CALLED TO, READ BACK BY AND VERIFIED WITH: RN MOGAN TAYLOR [phone] [facility]    Enterobacter cloacae complex NOT DETECTED NOT DETECTED Final   Escherichia coli NOT DETECTED NOT DETECTED Final   Klebsiella oxytoca NOT DETECTED NOT DETECTED Final   Klebsiella pneumoniae DETECTED (A) NOT DETECTED Final    Comment: CRITICAL RESULT CALLED TO, READ BACK BY AND VERIFIED WITH: RN MOGAN TAYLOR [phone] [facility]    Proteus species NOT DETECTED NOT DETECTED Final   Serratia marcescens NOT DETECTED NOT DETECTED Final   Carbapenem resistance NOT DETECTED NOT DETECTED Final   Haemophilus influenzae NOT DETECTED NOT DETECTED Final   Neisseria meningitidis NOT DETECTED NOT DETECTED Final   Pseudomonas aeruginosa  NOT DETECTED NOT DETECTED Final   Candida albicans NOT DETECTED NOT DETECTED Final   Candida glabrata NOT DETECTED NOT DETECTED Final   Candida krusei NOT DETECTED NOT DETECTED Final   Candida parapsilosis NOT DETECTED NOT DETECTED Final   Candida tropicalis NOT DETECTED NOT DETECTED Final    Comment: Performed at [facility], [place] [address] [place], [place] [address]  Culture, blood (Routine X 2) w Reflex to ID Panel     Status: None (Preliminary result)   Collection Time: 02/11/2018  2:15 PM  Result Value Ref Range Status   Specimen Description VEIN  Final   Special Requests BOTTLES DRAWN AEROBIC AND ANAEROBIC  Final   Culture   Final    NO GROWTH < 24 HOURS Performed at [facility], [address] [place],  [address]    Report Status PENDING  Incomplete     Studies: Ct Abdomen Pelvis W Contrast  Result Date: 01/23/2018 CLINICAL DATA:  Generalized weakness and abdominal pain. EXAM: CT ABDOMEN AND PELVIS WITH CONTRAST TECHNIQUE: Multidetector CT imaging of the abdomen and pelvis was performed using the standard protocol following bolus administration of intravenous contrast. CONTRAST:  154mL ISOVUE-300 IOPAMIDOL (ISOVUE-300) INJECTION 61% COMPARISON:  CT scan of March 16, 2017. FINDINGS: Lower chest: No acute abnormality. Hepatobiliary: Status post cholecystectomy. No biliary dilatation is noted. There is interval development of large hepatic masses in both hepatic lobes most consistent with metastatic disease. There appears to be complete replacement of normal parenchyma of left hepatic lobe with malignancy. Largest single mass measures 17.8 x 13.3 cm. Pancreas: Status post Whipple's procedure. Pancreatic tail is unremarkable. Multiple enlarged lymph nodes are noted in the surgical bed which are increased compared to prior exam, concerning for malignancy. The largest measures 2.5 cm in size. Superior mesenteric artery stent is occluded. Portal vein appears to be patent.  Spleen: Normal in size without focal abnormality. Adrenals/Urinary Tract: Adrenal glands are unremarkable. Nonobstructive left renal calculus is noted. No hydronephrosis or renal obstruction is noted. Bladder is unremarkable. Stomach/Bowel: The stomach is displaced by the enlarged left hepatic masses. But does not appear to be obstructed. There is no evidence of bowel obstruction or inflammation. The appendix is not clearly visualized. Vascular/Lymphatic: There is no evidence of abdominal aortic aneurysm or dissection. Increased periaortic adenopathy is noted consistent with metastatic disease. This includes 1.4 cm right periaortic lymph node. Reproductive: Prostate is unremarkable. Other: Large  periumbilical hernia is noted which contains fluid, but no bowel. Musculoskeletal: No acute or significant osseous findings. IMPRESSION: Interval development of large hepatic masses are noted consistent with metastatic disease secondary to pancreatic cancer, particularly in the left hepatic lobe anteriorly. This is most prominently seen in the left hepatic lobe with the largest single mass measuring 17.8 x 13.3 cm. Status post Whipple's procedure. While the pancreatic body and tail appear normal, there is interval development of multiple enlarged lymph nodes in the region of the pancreatic bed and retroperitoneal region consistent with metastatic disease. Large periumbilical hernia is noted which contains fluid, but no bowel. Electronically Signed   By: Marijo Conception, M.D.   On: 02/05/2018 10:46   Nm Pulmonary Perf And Vent  Result Date: 02/09/2018 CLINICAL DATA:  Shortness of breath for 1 month, chest pain for 1 month, question pulmonary embolism, high clinical pretest probability EXAM: NUCLEAR MEDICINE VENTILATION - PERFUSION LUNG SCAN TECHNIQUE: Ventilation images were obtained in multiple projections using inhaled aerosol Tc-8m DTPA. Perfusion images were obtained in multiple projections after intravenous  injection of Tc-35m MAA. RADIOPHARMACEUTICALS:  30 mCi of Tc-66m DTPA aerosol inhalation and 4.1 mCi Tc53m MAA IV COMPARISON:  None Correlation: Chest radiograph 01/14/2018 FINDINGS: Ventilation: Central airway deposition of aerosol. Diminished ventilation RIGHT lower lobe. No additional ventilatory abnormalities Perfusion: Matching diminished perfusion in RIGHT lower lobe. Remaining perfusion normal. Chest radiograph: Elevated RIGHT diaphragm with RIGHT basilar atelectasis. IMPRESSION: Matching diminished ventilation and perfusion in RIGHT lower lobe associated with RIGHT basilar atelectasis and elevation of RIGHT diaphragm on chest radiograph. The presence of triple matched large perfusion, ventilatory, and radiographic findings in a lower lobe represents an intermediate probability for pulmonary embolism. Electronically Signed   By: Lavonia Dana M.D.   On: 01/26/2018 16:18   Dg Chest Port 1 View  Result Date: 01/19/2018 CLINICAL DATA:  Shortness of breath and weakness. Abdominal pain. Liver cancer. EXAM: PORTABLE CHEST 1 VIEW COMPARISON:  09/01/2017. FINDINGS: Normal heart size. Elevated RIGHT hemidiaphragm. No consolidation or edema. Mild vascular congestion. IMPRESSION: Elevated RIGHT hemidiaphragm could reflect hepatic disease. No active cardiopulmonary disease. Electronically Signed   By: Staci Righter M.D.   On: 02/06/2018 07:13   Scheduled Meds: . sodium chloride   Intravenous Once  . enoxaparin (LOVENOX) injection  40 mg Subcutaneous Q24H  . feeding supplement  1 Container Oral TID BM  . feeding supplement (PRO-STAT SUGAR FREE 64)  30 mL Oral TID  . morphine  30 mg Oral Q12H  . oxyCODONE  20 mg Oral Q4H  . senna-docusate  2 tablet Oral BID   Continuous Infusions: . sodium chloride 100 mL/hr at 01/26/18 0055  . cefTRIAXone (ROCEPHIN)  IV    . magnesium sulfate 1 - 4 g bolus IVPB      Active Problems:   Ampullary carcinoma (HCC)   Chronic pain   Protein-calorie malnutrition, severe    UTI (urinary tract infection)   Generalized weakness   Anemia in neoplastic disease   Liver metastases (HCC)   Large Liver masses   GERD (gastroesophageal reflux disease)   Dehydration  Time spent:   Irwin Brakeman, MD, FAAFP [facility] Pager [phone]  If 7PM-7AM, please contact night-coverage [email or web address] Password [email or web address] 01/26/2018, 9:22 AM    LOS: 1 day

## 2018-01-26 NOTE — Progress Notes (Signed)
CRITICAL VALUE ALERT  Critical Value:  Hemoglobin 6.4  Date & Time Notied: 01/26/2018 0911  Provider Notified: Wynetta Emery  Orders Received/Actions taken: paged MD

## 2018-01-26 NOTE — Progress Notes (Addendum)
Patient BLOOD CULTURES gram negative rods aerobic and anaerobic.  Patient also c/o SOB, respiratory notified, lungs sounds are clear bilateral, pulse rate is 138 patient states he is really hurting bad.  Notified MD given orders to increase dilaudid to 1 mg @3H   For break through pain and place patient on continuous pulse ox.

## 2018-01-26 NOTE — Progress Notes (Signed)
PHARMACY - PHYSICIAN COMMUNICATION CRITICAL VALUE ALERT - BLOOD CULTURE IDENTIFICATION (BCID)  Luis Frey is an 47 y.o. male who presented to [facility] on 01/21/2018 with a chief complaint of weakness  Assessment:  Patient's BCID shows klebsiella pneumonia.  Current antibiotics: Ceftriaxone 2000 mg IV every 24 hours.  Changes to prescribed antibiotics recommended:  Patient is on recommended antibiotics - No changes needed  Results for orders placed or performed during the hospital encounter of 02/04/2018  Blood Culture ID Panel (Reflexed) (Collected: 01/22/2018 11:14 AM)  Result Value Ref Range   Enterococcus species NOT DETECTED NOT DETECTED   Listeria monocytogenes NOT DETECTED NOT DETECTED   Staphylococcus species NOT DETECTED NOT DETECTED   Staphylococcus aureus (BCID) NOT DETECTED NOT DETECTED   Streptococcus species NOT DETECTED NOT DETECTED   Streptococcus agalactiae NOT DETECTED NOT DETECTED   Streptococcus pneumoniae NOT DETECTED NOT DETECTED   Streptococcus pyogenes NOT DETECTED NOT DETECTED   Acinetobacter baumannii NOT DETECTED NOT DETECTED   Enterobacteriaceae species DETECTED (A) NOT DETECTED   Enterobacter cloacae complex NOT DETECTED NOT DETECTED   Escherichia coli NOT DETECTED NOT DETECTED   Klebsiella oxytoca NOT DETECTED NOT DETECTED   Klebsiella pneumoniae DETECTED (A) NOT DETECTED   Proteus species NOT DETECTED NOT DETECTED   Serratia marcescens NOT DETECTED NOT DETECTED   Carbapenem resistance NOT DETECTED NOT DETECTED   Haemophilus influenzae NOT DETECTED NOT DETECTED   Neisseria meningitidis NOT DETECTED NOT DETECTED   Pseudomonas aeruginosa NOT DETECTED NOT DETECTED   Candida albicans NOT DETECTED NOT DETECTED   Candida glabrata NOT DETECTED NOT DETECTED   Candida krusei NOT DETECTED NOT DETECTED   Candida parapsilosis NOT DETECTED NOT DETECTED   Candida tropicalis NOT DETECTED NOT DETECTED    Ramond Craver 01/26/2018  11:05 AM

## 2018-01-27 LAB — CBC WITH DIFFERENTIAL/PLATELET
Abs Immature Granulocytes: 1.41 10*3/uL — ABNORMAL HIGH (ref 0.00–0.07)
Basophils Absolute: 0.1 10*3/uL (ref 0.0–0.1)
Basophils Relative: 1 %
Eosinophils Absolute: 1 10*3/uL — ABNORMAL HIGH (ref 0.0–0.5)
Eosinophils Relative: 4 %
HCT: 29.3 % — ABNORMAL LOW (ref 39.0–52.0)
Hemoglobin: 8.7 g/dL — ABNORMAL LOW (ref 13.0–17.0)
Immature Granulocytes: 6 %
Lymphocytes Relative: 6 %
Lymphs Abs: 1.5 10*3/uL (ref 0.7–4.0)
MCH: 26.6 pg (ref 26.0–34.0)
MCHC: 29.7 g/dL — AB (ref 30.0–36.0)
MCV: 89.6 fL (ref 80.0–100.0)
Monocytes Absolute: 1.5 10*3/uL — ABNORMAL HIGH (ref 0.1–1.0)
Monocytes Relative: 6 %
Neutro Abs: 19.9 10*3/uL — ABNORMAL HIGH (ref 1.7–7.7)
Neutrophils Relative %: 77 %
Platelets: 207 10*3/uL (ref 150–400)
RBC: 3.27 MIL/uL — ABNORMAL LOW (ref 4.22–5.81)
RDW: 25.6 % — ABNORMAL HIGH (ref 11.5–15.5)
WBC: 25.4 10*3/uL — AB (ref 4.0–10.5)
nRBC: 0.2 % (ref 0.0–0.2)

## 2018-01-27 LAB — BPAM RBC
Blood Product Expiration Date: 202001072359
Blood Product Expiration Date: 202001102359
ISSUE DATE / TIME: 201912152347
ISSUE DATE / TIME: 201912151810
Unit Type and Rh: 5100
Unit Type and Rh: 5100

## 2018-01-27 LAB — URINE CULTURE

## 2018-01-27 LAB — MAGNESIUM: Magnesium: 1.6 mg/dL — ABNORMAL LOW (ref 1.7–2.4)

## 2018-01-27 LAB — COMPREHENSIVE METABOLIC PANEL
ALT: 24 U/L (ref 0–44)
AST: 79 U/L — ABNORMAL HIGH (ref 15–41)
Albumin: 1.7 g/dL — ABNORMAL LOW (ref 3.5–5.0)
Alkaline Phosphatase: 377 U/L — ABNORMAL HIGH (ref 38–126)
Anion gap: 9 (ref 5–15)
BUN: 11 mg/dL (ref 6–20)
CO2: 21 mmol/L — ABNORMAL LOW (ref 22–32)
Calcium: 8.1 mg/dL — ABNORMAL LOW (ref 8.9–10.3)
Chloride: 104 mmol/L (ref 98–111)
Creatinine, Ser: 0.4 mg/dL — ABNORMAL LOW (ref 0.61–1.24)
GFR calc Af Amer: >60 mL/min (ref >60)
GFR calc non Af Amer: >60 mL/min (ref >60)
Glucose, Bld: 89 mg/dL (ref 70–99)
POTASSIUM: 3.8 mmol/L (ref 3.5–5.1)
Sodium: 134 mmol/L — ABNORMAL LOW (ref 135–145)
Total Bilirubin: 3.4 mg/dL — ABNORMAL HIGH (ref 0.3–1.2)
Total Protein: 4.9 g/dL — ABNORMAL LOW (ref 6.5–8.1)

## 2018-01-27 LAB — TYPE AND SCREEN
ABO/RH(D): O POS
Antibody Screen: NEGATIVE
Unit division: 0
Unit division: 0

## 2018-01-27 LAB — PROTIME-INR
INR: 2.25
Prothrombin Time: 24.6 seconds — ABNORMAL HIGH (ref 11.4–15.2)

## 2018-01-27 MED ORDER — HYDROMORPHONE HCL 1 MG/ML IJ SOLN
1.0000 mg | INTRAMUSCULAR | Status: DC | PRN
  Administered 2018-01-29 – 2018-01-30 (×8): 1 mg via INTRAVENOUS
  Filled 2018-01-27 (×8): qty 1

## 2018-01-27 MED ORDER — MAGNESIUM SULFATE 4 GM/100ML IV SOLN
4.0000 g | Freq: Once | INTRAVENOUS | Status: AC
  Administered 2018-01-27: 4 g via INTRAVENOUS
  Filled 2018-01-27: qty 100

## 2018-01-27 NOTE — Progress Notes (Signed)
PROGRESS NOTE  Luis Frey  HMC:[id number]  DOB: October 25, 1970  DOA: 01/30/2018 PCP: Lavella Lemons, PA   Brief Admission Hx: 47 year old male presented to emergency room with generalized weakness and abdominal pain.  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  He was noted to have worsening of the liver metastasis and evidence of pancreatic metastasis on admission.  He had been treated at the cancer [facility] [place] in [place].  He was admitted with a UTI.  MDM/Assessment & Plan:   1. 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  His CT scan is consistent with large liver metastases.  We do not have any recent CT scans to compare as he has been receiving scans and treatments at the cancer treatment center [facility] [place] in [place] but wife confirms that tumors are much larger now than in September.  The patient was started on supportive therapy.  I began goals of care discussions and patient wants to continue full scope treatment and pain management for the time being.  I would like for the patient to meet with the palliative medicine team on Monday when they are available in the hospital.    He does not want hospice at this time but also says he does not want to die in the hospital. He wants to remain full code at this time.  He seems to be in denial of how seriously ill he is at this time.  Will give family more time to process.  I had a separate meeting with wife to explain the seriousness of patient's condition.  2. Klebsiella UTI-urine culture confirmed, awaiting sensitivities, continue IV ceftriaxone.  Follow blood cultures. 3. Klebsiella Gram negative rod bacteremia - increased ceftriaxone dose to 2 gm IV.  Follow C&S.  4. Presumed PE - V/Q scan along with clinical presentation is Highly suggestive of PE. The patient is in active liver failure and his INR>3  so no further anticoagulation ordered.  Continue supportive care.   5. Shortness of breath-patient likely has a PE. Continue supplemental oxygen for comfort.  6. 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  Transfused 2 unit PRBC. Hg now up to 8.7.    7. Elevated lactic acid - likely elevated in setting of severe dehydration, metastatic cancer and infection.  IV fluid boluses and continuous IV fluids ordered.  8. Dehydration-Continue IV fluid hydration. 9. GERD-Protonix for GI protection. 10. Chronic constipation-opioid induced- continue laxatives as ordered. 11. Chronic pain related to metastatic cancer- suboptimally controlled, resumed home pain medication regimen with IV Dilaudid ordered for breakthrough pain and severe uncontrolled pain symptoms.  Obtain palliative medicine consult.  12. Severe protein calorie malnutrition-patient has not been eating well in the last 2 weeks will ask for dietitian to assist with supplements while in the hospital.  DVT Prophylaxis: Lovenox DC'd due to INR>3 Code Status: Full Family Communication: Wife, I spoke with her at length and explained that patient is terminally ill with the beginnings of multiorgan failure and that they should seriously consider full comfort care/hospice services to avoid prolonged suffering.   Disposition Plan: To be determined Consultants:  Palliative medicine  Antimicrobials:  Ceftriaxone 12/14 >   Subjective: Pain is not fully controlled, but much better than yesterday.  Still poor appetite and feels terrible.    Objective: Vitals:   01/27/18 0018 01/27/18 0200 01/27/18 0532 01/27/18 0922  BP: 117/76 107/67  115/79   Pulse: (!) 111 (!) 115 (!) 120   Resp: 18 18 18    Temp: 98.5 F (36.9 C) 98.4 F (36.9 C) 97.6 F (36.4 C)   TempSrc: Axillary Axillary Oral   SpO2: 100% 100% 100% 100%  Weight:      Height:        Intake/Output Summary (Last 24 hours) at 01/27/2018 1118 Last data filed at 01/27/2018  0900 Gross per 24 hour  Intake 1977.5 ml  Output 750 ml  Net 1227.5 ml   Filed Weights   01/17/2018 0501  Weight: 86.2 kg   REVIEW OF SYSTEMS  As per history otherwise all reviewed and reported negative  Exam:  General exam: appears terminally ill.  He is awake, he is in no acute distress. He remains jaundiced. Bilateral scleral icterus. Respiratory system: shallow breathing bilateral. No increased work of breathing. Cardiovascular system: normal S1 & S2 heard. No JVD, murmurs, gallops, clicks or pedal edema. Gastrointestinal system: Abdomen is distended, tender with hepatomegaly. Normal bowel sounds heard. Central nervous system: Alert and oriented. No focal neurological deficits. Extremities: no cyanosis or clubbing.  Data Reviewed: Basic Metabolic Panel: Recent Labs  Lab 01/30/2018 0609 01/26/18 0732 01/27/18 0602  NA 131* 131* 134*  K 4.4 3.7 3.8  CL 96* 103 104  CO2 22 21* 21*  GLUCOSE 85 124* 89  BUN 17 13 11   CREATININE 0.59* 0.54* 0.40*  CALCIUM 8.2* 7.6* 8.1*  MG  --  1.6* 1.6*   Liver Function Tests: Recent Labs  Lab 01/14/2018 0609 01/26/18 0732 01/27/18 0602  AST 78* 83* 79*  ALT 25 24 24   ALKPHOS 385* 353* 377*  BILITOT 3.8* 2.8* 3.4*  PROT 5.5* 4.8* 4.9*  ALBUMIN 1.9* 1.7* 1.7*   No results for input(s): LIPASE, AMYLASE in the last 168 hours. Recent Labs  Lab 01/15/2018 0609  AMMONIA 38*   CBC: Recent Labs  Lab 01/19/2018 0609 01/26/18 0838 01/27/18 0602  WBC 28.7* 24.2* 25.4*  NEUTROABS 23.1* 19.6* 19.9*  HGB 7.6* 6.4* 8.7*  HCT 25.8* 22.4* 29.3*  MCV 90.8 92.6 89.6  PLT 313 242 207   Cardiac Enzymes: Recent Labs  Lab 01/16/2018 0609 01/15/2018 0953  CKTOTAL  --  21*  TROPONINI 0.06* 0.05*   CBG (last 3)  No results for input(s): GLUCAP in the last 72 hours. Recent Results (from the past 240 hour(s))  Urine Culture     Status: Abnormal   Collection Time: 01/12/2018 11:01 AM  Result Value Ref Range Status   Specimen Description    Final    URINE, CLEAN CATCH Performed at [facility], [address] [place], [place] [address]    Special Requests   Final    NONE Performed at [facility], [address] [place], [place] [address]    Culture >=100,000 COLONIES/mL KLEBSIELLA PNEUMONIAE (A)  Final   Report Status 01/27/2018 FINAL  Final   Organism ID, Bacteria KLEBSIELLA PNEUMONIAE (A)  Final      Susceptibility   Klebsiella pneumoniae - MIC*    AMPICILLIN >=32 RESISTANT Resistant     CEFAZOLIN <=4 SENSITIVE Sensitive     CEFTRIAXONE <=1 SENSITIVE Sensitive     CIPROFLOXACIN <=0.25 SENSITIVE Sensitive     GENTAMICIN <=1 SENSITIVE Sensitive     IMIPENEM <=0.25 SENSITIVE Sensitive     NITROFURANTOIN 128 RESISTANT Resistant     TRIMETH/SULFA <=20 SENSITIVE Sensitive     AMPICILLIN/SULBACTAM 4 SENSITIVE Sensitive     PIP/TAZO <=4 SENSITIVE  Sensitive     Extended ESBL NEGATIVE Sensitive     * >=100,000 COLONIES/mL KLEBSIELLA PNEUMONIAE  Culture, blood (Routine X 2) w Reflex to ID Panel     Status: Abnormal (Preliminary result)   Collection Time: 02/09/2018 11:14 AM  Result Value Ref Range Status   Specimen Description   Final    PORTA CATH Performed at [facility], [address] [place], [place] [address]    Special Requests   Final    BOTTLES DRAWN AEROBIC AND ANAEROBIC Blood Culture adequate volume Performed at [facility], [address] [place], [place] [address]    Culture  Setup Time   Final    GRAM NEGATIVE RODS Gram Stain Report Called to,Read Back By and Verified With: THOMAS,K. AT 0107 ON 01/26/2018 BY EVA IN BOTH AEROBIC AND ANAEROBIC BOTTLES Performed at [place], READ BACK BY AND VERIFIED WITH: RN [facility] TAYLOR [phone] FCP    Culture (A)  Final    KLEBSIELLA PNEUMONIAE SUSCEPTIBILITIES TO FOLLOW Performed at [facility], [place] [address] [place], [place] [address]    Report Status PENDING  Incomplete  Blood Culture ID Panel (Reflexed)     Status:  Abnormal   Collection Time: 01/28/2018 11:14 AM  Result Value Ref Range Status   Enterococcus species NOT DETECTED NOT DETECTED Final   Listeria monocytogenes NOT DETECTED NOT DETECTED Final   Staphylococcus species NOT DETECTED NOT DETECTED Final   Staphylococcus aureus (BCID) NOT DETECTED NOT DETECTED Final   Streptococcus species NOT DETECTED NOT DETECTED Final   Streptococcus agalactiae NOT DETECTED NOT DETECTED Final   Streptococcus pneumoniae NOT DETECTED NOT DETECTED Final   Streptococcus pyogenes NOT DETECTED NOT DETECTED Final   Acinetobacter baumannii NOT DETECTED NOT DETECTED Final   Enterobacteriaceae species DETECTED (A) NOT DETECTED Final    Comment: Enterobacteriaceae represent a large family of gram-negative bacteria, not a single organism. CRITICAL RESULT CALLED TO, READ BACK BY AND VERIFIED WITH: RN MOGAN TAYLOR [phone] FCP    Enterobacter cloacae complex NOT DETECTED NOT DETECTED Final   Escherichia coli NOT DETECTED NOT DETECTED Final   Klebsiella oxytoca NOT DETECTED NOT DETECTED Final   Klebsiella pneumoniae DETECTED (A) NOT DETECTED Final    Comment: CRITICAL RESULT CALLED TO, READ BACK BY AND VERIFIED WITH: RN MOGAN TAYLOR [phone] FCP    Proteus species NOT DETECTED NOT DETECTED Final   Serratia marcescens NOT DETECTED NOT DETECTED Final   Carbapenem resistance NOT DETECTED NOT DETECTED Final   Haemophilus influenzae NOT DETECTED NOT DETECTED Final   Neisseria meningitidis NOT DETECTED NOT DETECTED Final   Pseudomonas aeruginosa NOT DETECTED NOT DETECTED Final   Candida albicans NOT DETECTED NOT DETECTED Final   Candida glabrata NOT DETECTED NOT DETECTED Final   Candida krusei NOT DETECTED NOT DETECTED Final   Candida parapsilosis NOT DETECTED NOT DETECTED Final   Candida tropicalis NOT DETECTED NOT DETECTED Final    Comment: Performed at [facility], [place] [address] [place], [place] [address]  Culture, blood (Routine X 2) w Reflex to ID Panel      Status: None (Preliminary result)   Collection Time: 02/04/2018  2:15 PM  Result Value Ref Range Status   Specimen Description BLOOD RIGHT WRIST  Final   Special Requests   Final    BOTTLES DRAWN AEROBIC AND ANAEROBIC Blood Culture adequate volume   Culture   Final    NO GROWTH 2 DAYS Performed  at [facility], [address], [place], [place] [address]    Report Status PENDING  Incomplete     Studies: Nm Pulmonary Perf And Vent  Result Date: 01/29/2018 CLINICAL DATA:  Shortness of breath for 1 month, chest pain for 1 month, question pulmonary embolism, high clinical pretest probability EXAM: NUCLEAR MEDICINE VENTILATION - PERFUSION LUNG SCAN TECHNIQUE: Ventilation images were obtained in multiple projections using inhaled aerosol Tc-33m DTPA. Perfusion images were obtained in multiple projections after intravenous injection of Tc-64m MAA. RADIOPHARMACEUTICALS:  30 mCi of Tc-81m DTPA aerosol inhalation and 4.1 mCi Tc61m MAA IV COMPARISON:  None Correlation: Chest radiograph 02/05/2018 FINDINGS: Ventilation: Central airway deposition of aerosol. Diminished ventilation RIGHT lower lobe. No additional ventilatory abnormalities Perfusion: Matching diminished perfusion in RIGHT lower lobe. Remaining perfusion normal. Chest radiograph: Elevated RIGHT diaphragm with RIGHT basilar atelectasis. IMPRESSION: Matching diminished ventilation and perfusion in RIGHT lower lobe associated with RIGHT basilar atelectasis and elevation of RIGHT diaphragm on chest radiograph. The presence of triple matched large perfusion, ventilatory, and radiographic findings in a lower lobe represents an intermediate probability for pulmonary embolism. Electronically Signed   By: Lavonia Dana M.D.   On: 01/17/2018 16:18   Scheduled Meds: . feeding supplement  1 Container Oral TID BM  . feeding supplement (PRO-STAT SUGAR FREE 64)  30 mL Oral TID  . morphine  30 mg Oral Q12H  . oxyCODONE  20 mg Oral Q4H  . senna-docusate  2 tablet  Oral BID   Continuous Infusions: . sodium chloride 75 mL/hr at 01/27/18 0030  . cefTRIAXone (ROCEPHIN)  IV 2 g (01/27/18 1006)  . magnesium sulfate 1 - 4 g bolus IVPB      Active Problems:   Gram-negative bacteremia   Presumed Pulmonary embolus   Ampullary carcinoma (HCC)   Chronic pain   Protein-calorie malnutrition, severe   Generalized weakness   Anemia in neoplastic disease   Liver metastases (HCC)   Large Liver masses   GERD (gastroesophageal reflux disease)   Dehydration   Leukocytosis   Liver failure, acute   Elevated INR   UTI due to Klebsiella species  Time spent:   Irwin Brakeman, MD, FAAFP [facility] Pager [phone]  If 7PM-7AM, please contact night-coverage [email or web address] Password [email or web address] 01/27/2018, 11:18 AM    LOS: 2 days

## 2018-01-28 DIAGNOSIS — R7881 Bacteremia: Secondary | ICD-10-CM

## 2018-01-28 DIAGNOSIS — N3001 Acute cystitis with hematuria: Secondary | ICD-10-CM

## 2018-01-28 DIAGNOSIS — R791 Abnormal coagulation profile: Secondary | ICD-10-CM

## 2018-01-28 LAB — COMPREHENSIVE METABOLIC PANEL
ALT: 26 U/L (ref 0–44)
AST: 77 U/L — AB (ref 15–41)
Albumin: 1.7 g/dL — ABNORMAL LOW (ref 3.5–5.0)
Alkaline Phosphatase: 337 U/L — ABNORMAL HIGH (ref 38–126)
Anion gap: 8 (ref 5–15)
BUN: 12 mg/dL (ref 6–20)
CO2: 21 mmol/L — ABNORMAL LOW (ref 22–32)
Calcium: 8.3 mg/dL — ABNORMAL LOW (ref 8.9–10.3)
Chloride: 105 mmol/L (ref 98–111)
Creatinine, Ser: 0.36 mg/dL — ABNORMAL LOW (ref 0.61–1.24)
GFR calc Af Amer: >60 mL/min (ref >60)
GFR calc non Af Amer: >60 mL/min (ref >60)
Glucose, Bld: 95 mg/dL (ref 70–99)
Potassium: 4.1 mmol/L (ref 3.5–5.1)
Sodium: 134 mmol/L — ABNORMAL LOW (ref 135–145)
Total Bilirubin: 3.2 mg/dL — ABNORMAL HIGH (ref 0.3–1.2)
Total Protein: 5 g/dL — ABNORMAL LOW (ref 6.5–8.1)

## 2018-01-28 LAB — CBC WITH DIFFERENTIAL/PLATELET
Abs Immature Granulocytes: 1.39 10*3/uL — ABNORMAL HIGH (ref 0.00–0.07)
Basophils Absolute: 0.1 10*3/uL (ref 0.0–0.1)
Basophils Relative: 0 %
Eosinophils Absolute: 0.1 10*3/uL (ref 0.0–0.5)
Eosinophils Relative: 0 %
HCT: 29.4 % — ABNORMAL LOW (ref 39.0–52.0)
Hemoglobin: 8.7 g/dL — ABNORMAL LOW (ref 13.0–17.0)
Immature Granulocytes: 5 %
Lymphocytes Relative: 6 %
Lymphs Abs: 1.7 10*3/uL (ref 0.7–4.0)
MCH: 27 pg (ref 26.0–34.0)
MCHC: 29.6 g/dL — ABNORMAL LOW (ref 30.0–36.0)
MCV: 91.3 fL (ref 80.0–100.0)
Monocytes Absolute: 1.9 10*3/uL — ABNORMAL HIGH (ref 0.1–1.0)
Monocytes Relative: 7 %
Neutro Abs: 22.5 10*3/uL — ABNORMAL HIGH (ref 1.7–7.7)
Neutrophils Relative %: 82 %
Platelets: 193 10*3/uL (ref 150–400)
RBC: 3.22 MIL/uL — ABNORMAL LOW (ref 4.22–5.81)
RDW: 26 % — ABNORMAL HIGH (ref 11.5–15.5)
WBC: 27.7 10*3/uL — AB (ref 4.0–10.5)
nRBC: 0.2 % (ref 0.0–0.2)

## 2018-01-28 LAB — CULTURE, BLOOD (ROUTINE X 2): Special Requests: ADEQUATE

## 2018-01-28 LAB — PROTIME-INR
INR: 2.5
Prothrombin Time: 26.7 seconds — ABNORMAL HIGH (ref 11.4–15.2)

## 2018-01-28 MED ORDER — BENZONATATE 100 MG PO CAPS
200.0000 mg | ORAL_CAPSULE | Freq: Three times a day (TID) | ORAL | Status: DC | PRN
  Administered 2018-01-28 – 2018-01-30 (×2): 200 mg via ORAL
  Filled 2018-01-28 (×2): qty 2

## 2018-01-28 MED ORDER — PROCHLORPERAZINE EDISYLATE 10 MG/2ML IJ SOLN
10.0000 mg | INTRAMUSCULAR | Status: AC
  Administered 2018-01-28: 10 mg via INTRAVENOUS
  Filled 2018-01-28: qty 2

## 2018-01-28 MED ORDER — RESOURCE INSTANT PROTEIN PO PWD PACKET
1.0000 | Freq: Three times a day (TID) | ORAL | Status: DC
  Filled 2018-01-28 (×16): qty 6

## 2018-01-28 MED ORDER — BOOST / RESOURCE BREEZE PO LIQD CUSTOM
1.0000 | Freq: Two times a day (BID) | ORAL | Status: DC
  Administered 2018-01-29 (×2): 1 via ORAL

## 2018-01-28 MED ORDER — SODIUM CHLORIDE 0.9% FLUSH: 10.0000 mL | INTRAVENOUS | Status: DC | PRN

## 2018-01-28 MED ORDER — SODIUM CHLORIDE 0.9% FLUSH
10.0000 mL | Freq: Two times a day (BID) | INTRAVENOUS | Status: DC
  Administered 2018-01-28 – 2018-01-31 (×5): 10 mL

## 2018-01-28 MED ORDER — LINACLOTIDE 145 MCG PO CAPS
290.0000 ug | ORAL_CAPSULE | Freq: Every day | ORAL | Status: DC
  Administered 2018-01-28 – 2018-01-30 (×3): 290 ug via ORAL
  Filled 2018-01-28 (×3): qty 2

## 2018-01-28 NOTE — Progress Notes (Signed)
Discussed DNR bracelet with patient this afternoon based on DNR order after his conversation with Dr. Roderic Palau earlier today. Patient stated "no, no you all misunderstood me. I want everything done here to get me home and nothing done at home when my time comes." patient's wife states "he has gone back and forth on what he wants and right now wants everything done." notified Dr. Roderic Palau. Luis Foil, RN

## 2018-01-28 NOTE — Progress Notes (Signed)
Spiritual care consult in place. Left message for Luz Brazen, Chaplain to see patient if possible today. Donavan Foil, RN

## 2018-01-28 NOTE — Progress Notes (Signed)
Nutrition Follow-up  DOCUMENTATION CODES:  Non-severe (moderate) malnutrition in context of chronic illness   INTERVENTION:  Decreased Boost Breeze po to BID, each supplement provides 250 kcal and 9 grams of protein  Beneprotein-can be added to pt meals   D/C Prostat   Received several pt food preferences/requests from spouse. Will order.   NUTRITION DIAGNOSIS:  Moderate Malnutrition(Chronic) related to cancer and cancer related treatments, poor appetite as evidenced by moderate muscle/fat depletions and an energy intake that has met </= to 75% of estimated needs for >/= to 1 month   GOAL:  Patient will meet greater than or equal to 90% of their needs  MONITOR:  PO intake, Supplement acceptance, Diet advancement, Labs, I & O's, Weight trends, GOC  REASON FOR ASSESSMENT:  Consult Assessment of nutrition requirement/status  ASSESSMENT:  47 y/o male PMHx of ampullary carcinoma 06/2016 s/p whipple 6//27/2018. In April of this year, liver lesion biopsy positive for metastatic disease. Has been receiving chemo at [facility] [place] in [place]. Presented to ED d/t worsening weakness, SOB following chemo. CT of liver in ED showed large liver masses. Admitted for management. RD consulted d/t report of weight loss and very poor appetite.   Following up from remote encounter to see if RD could assist with diet optimization.   Pt asleep on RD arrival. Spouse notes that pt "is exhausted". She says his intake is exceedingly poor. She believes a loss of appetite is the main reason for his poor intake, not diet intolerance. However, she does note pt has developed aversions to certain "textures". From her report, the items he has accepted recently are lighter foods; pineapple juice, fruit cups, puddings. Ice cream, mashed potatoes etc.   Supplement-wise, she says the patient does not like the prostat. PTA, the patient had been drinking [employer] regularly, though  she brought him some during this admission and he no longer would accept it. He is doing well with the Boost Breeze.   RD discussed some other supplement options. Will try unjury protein powder, which can be added to applesauce/puddings.   RD noted from chart notes that pt is set on aggressive care and being full code. RD told spouse that if this is truly what he wants, he needs to eat, regardless of whether he has an appetite or not.   Will order the beneprotein and several of the aforemetioned items that patient has been tolerating. There were roughly a half dozen un opened boost breeze supplements->will scale back ordering.   He does not appear to have been weighed at [facility] yet, but pt spouse reported pt was 192 lbs in November.  Per chart, his wt at initial consult for ampullary cancer on 07/17/2016 was 263 lbs. At that time, he reportedly already lost 40 lbs since Jan 2018, placing his UBW ~300 lbs. This is given credence with a sole encounter from 2016 showing a wt of 296 lbs. Following whipple in June 2018, continued to lose weight. Was 176 lbs in Feb 2019, but appears to have gained some wt back; his last wt available in Care Everywhere was 214.5. THis was taken in May of this year.    Labs: Albumin: 1.9->1.7, WBC:27.7,  Na: 131-> 134,  Meds: Senna-docusate, IVF, IV abx  Recent Labs  Lab 01/26/18 0732 01/27/18 0602 01/28/18 0527  NA 131* 134* 134*  K 3.7 3.8 4.1  CL 103 104 105  CO2 21* 21* 21*  BUN '13 11 12  ' CREATININE 0.54*  0.40* 0.36*  CALCIUM 7.6* 8.1* 8.3*  MG 1.6* 1.6*  --   GLUCOSE 124* 89 95   NUTRITION - FOCUSED PHYSICAL EXAM:   Most Recent Value  Orbital Region  Moderate depletion  Upper Arm Region  Severe depletion  Thoracic and Lumbar Region  Moderate depletion  Buccal Region  Moderate depletion  Temple Region  Moderate depletion  Clavicle Bone Region  Moderate depletion  Clavicle and Acromion Bone Region  Moderate depletion  Scapular Bone Region  Unable to  assess  Dorsal Hand  Moderate depletion  Patellar Region  No depletion  Anterior Thigh Region  Moderate depletion  Posterior Calf Region  Moderate depletion  Edema (RD Assessment)  Severe     Diet Order:   Diet Order            Diet regular Room service appropriate? Yes; Fluid consistency: Thin  Diet effective now             EDUCATION NEEDS:  No education needs have been identified at this time  Skin:  Skin Assessment: Reviewed RN Assessment  Last BM:  12/14  Height:  Ht Readings from Last 1 Encounters:  01/16/2018 '5\' 11"'  (1.803 m)   Weight:  Wt Readings from Last 1 Encounters:  01/20/2018 86.2 kg   Wt Readings from Last 10 Encounters:  01/17/2018 86.2 kg  03/27/17 79.8 kg  03/16/17 90.7 kg  12/08/16 90.7 kg  10/29/16 94.7 kg  07/06/16 117.9 kg  07/05/16 117.9 kg  07/05/16 118.1 kg  03/21/16 119.4 kg   Ideal Body Weight:  78.18 kg  BMI:  Body mass index is 26.5 kg/m.  Estimated Nutritional Needs:  Kcal:  2250-2450 kcals (30 kcal/kg ibw +/- 100) Protein:  117-133 g Pro (1.5-1.7g/kg ibw) Fluid:  Per MD goals  Burtis Junes RD, LDN, CNSC Clinical Nutrition Available Tues-Sat via Pager: [phone] 01/28/2018 4:56 PM

## 2018-01-28 NOTE — Progress Notes (Signed)
PROGRESS NOTE  Luis Frey  ZWC:[id number]  DOB: Jul 24, 1970  DOA: 01/12/2018 PCP: Lavella Lemons, PA   Brief Admission Hx: 47 year old male presented to emergency room with generalized weakness and abdominal pain.  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  He was noted to have worsening of the liver metastasis and evidence of pancreatic metastasis on admission.  He had been treated at the cancer center [facility] of [place] in [place].  He was admitted with a UTI.  MDM/Assessment & Plan:   1. 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  His CT scan is consistent with large liver metastases.  We do not have any recent CT scans to compare as he has been receiving scans and treatments at the [facility] in [place] but wife confirms that tumors are much larger now than in September.  The patient was started on supportive therapy.  Patient has very poor functional and nutritional status.  His long-term prognosis is poor.  Goals of care discussions were initiated with the patient and his wife.  His wife does appear to be understanding regarding his prognosis, but patient does not appear to be ready to stop treatment on his cancer.  He is not willing to accept hospice services at this time.  He wishes to continue active treatments.  He is having a difficult time coming to terms with his prognosis/mortality.  Palliative care and spiritual consult has been requested. 2. Klebsiella UTI- currently on ceftriaxone.  Follow blood cultures. 3. Klebsiella Gram negative rod bacteremia -continue ceftriaxone dose to 2 gm IV.  Follow C&S.  4. Presumed PE - V/Q scan along with clinical presentation is Highly suggestive of PE. The patient is in active liver failure and his INR>3 so no further anticoagulation ordered.  Continue supportive care.   5. Shortness of  breath-patient likely has a PE. Continue supplemental oxygen for comfort.  6. 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  He was transfused 2 unit PRBC.  Follow-up hemoglobin has been stable at 8.7 7. Dehydration- see discontinue further IV fluids since he is third spacing 8. GERD-Protonix for GI protection. 9. Chronic constipation-opioid induced- continue laxatives as ordered.  We will add in [place]. 10. Chronic pain related to metastatic cancer- reports fair control with MS Contin and frequent oxycodone. 11. Severe protein calorie malnutrition-patient has not been eating well in the last 2 weeks.  Nutrition following 12. Goals of care.  I had a long discussion with the patient and his wife.  We reviewed his current state of health, progression of disease and expected prognosis.  He wishes to continue to seek active treatment for his cancer.  He is not interested in hospice at this time.  Initially, when I talked about end-of-life, patient expressed his wishes to die at home without any aggressive measures.  DNR was put into place.  When this was readdressed by staff later on the day, patient said that he would want everything done.  Clearly, he is having a difficult time coming to terms with his prognosis and mortality.  Palliative care consulted to help address goals of care.  DVT Prophylaxis: Lovenox DC'd due to INR>3 Code Status: Full Family Communication:  Discussed with wife at the bedside Disposition Plan: To be determined  Consultants:  Palliative medicine  Antimicrobials:  Ceftriaxone 12/14 >   Subjective: Continues to have  abdominal pain.  Feels weak.  Appetite is been poor.  Occasionally feels short of breath.  Still having nausea.  Bowel movements have been poor.  Objective: Vitals:   01/27/18 2102 01/27/18 2136 01/28/18 0530 01/28/18 1550  BP:  113/75 (!) 108/57 121/77  Pulse:  (!) 107 (!) 122 (!) 110  Resp:  18 18 18   Temp:  97.7 F (36.5 C) 98.3 F  (36.8 C) 98.6 F (37 C)  TempSrc:  Oral Oral Oral  SpO2: 99% 100% 99% 100%  Weight:      Height:        Intake/Output Summary (Last 24 hours) at 01/28/2018 1634 Last data filed at 01/28/2018 1552 Gross per 24 hour  Intake 2742.7 ml  Output 1300 ml  Net 1442.7 ml   Filed Weights   02/07/2018 0501  Weight: 86.2 kg   REVIEW OF SYSTEMS  As per history otherwise all reviewed and reported negative  Exam:  General exam: Alert, awake, oriented x 3 Respiratory system: Clear to auscultation. Respiratory effort normal. Cardiovascular system:RRR. No murmurs, rubs, gallops. Gastrointestinal system: Abdomen is distended, soft and diffusely tender. No organomegaly or masses felt. Normal bowel sounds heard. Central nervous system: Alert and oriented. No focal neurological deficits. Extremities: 1-2+ pitting edema in the lower extremities Skin: No rashes, lesions or ulcers Psychiatry: Judgement and insight appear normal. Mood & affect appropriate.    Data Reviewed: Basic Metabolic Panel: Recent Labs  Lab 02/07/2018 0609 01/26/18 0732 01/27/18 0602 01/28/18 0527  NA 131* 131* 134* 134*  K 4.4 3.7 3.8 4.1  CL 96* 103 104 105  CO2 22 21* 21* 21*  GLUCOSE 85 124* 89 95  BUN 17 13 11 12   CREATININE 0.59* 0.54* 0.40* 0.36*  CALCIUM 8.2* 7.6* 8.1* 8.3*  MG  --  1.6* 1.6*  --    Liver Function Tests: Recent Labs  Lab 02/07/2018 0609 01/26/18 0732 01/27/18 0602 01/28/18 0527  AST 78* 83* 79* 77*  ALT 25 24 24 26   ALKPHOS 385* 353* 377* 337*  BILITOT 3.8* 2.8* 3.4* 3.2*  PROT 5.5* 4.8* 4.9* 5.0*  ALBUMIN 1.9* 1.7* 1.7* 1.7*   No results for input(s): LIPASE, AMYLASE in the last 168 hours. Recent Labs  Lab 01/31/2018 0609  AMMONIA 38*   CBC: Recent Labs  Lab 02/11/2018 0609 01/26/18 0838 01/27/18 0602 01/28/18 0527  WBC 28.7* 24.2* 25.4* 27.7*  NEUTROABS 23.1* 19.6* 19.9* 22.5*  HGB 7.6* 6.4* 8.7* 8.7*  HCT 25.8* 22.4* 29.3* 29.4*  MCV 90.8 92.6 89.6 91.3  PLT 313 242  207 193   Cardiac Enzymes: Recent Labs  Lab 01/13/2018 0609 01/31/2018 0953  CKTOTAL  --  21*  TROPONINI 0.06* 0.05*   CBG (last 3)  No results for input(s): GLUCAP in the last 72 hours. Recent Results (from the past 240 hour(s))  Urine Culture     Status: Abnormal   Collection Time: 01/17/2018 11:01 AM  Result Value Ref Range Status   Specimen Description   Final    URINE, CLEAN CATCH Performed at [facility], [address] [place], [place] [address]    Special Requests   Final    NONE Performed at [facility], [address], [place], [place] [address]    Culture >=100,000 COLONIES/mL KLEBSIELLA PNEUMONIAE (A)  Final   Report Status 01/27/2018 FINAL  Final   Organism ID, Bacteria KLEBSIELLA PNEUMONIAE (A)  Final      Susceptibility   Klebsiella pneumoniae - MIC*  AMPICILLIN >=32 RESISTANT Resistant     CEFAZOLIN <=4 SENSITIVE Sensitive     CEFTRIAXONE <=1 SENSITIVE Sensitive     CIPROFLOXACIN <=0.25 SENSITIVE Sensitive     GENTAMICIN <=1 SENSITIVE Sensitive     IMIPENEM <=0.25 SENSITIVE Sensitive     NITROFURANTOIN 128 RESISTANT Resistant     TRIMETH/SULFA <=20 SENSITIVE Sensitive     AMPICILLIN/SULBACTAM 4 SENSITIVE Sensitive     PIP/TAZO <=4 SENSITIVE Sensitive     Extended ESBL NEGATIVE Sensitive     * >=100,000 COLONIES/mL KLEBSIELLA PNEUMONIAE  Culture, blood (Routine X 2) w Reflex to ID Panel     Status: Abnormal   Collection Time: 01/13/2018 11:14 AM  Result Value Ref Range Status   Specimen Description   Final    PORTA CATH Performed at [facility], [address], [place], [place] [address]    Special Requests   Final    BOTTLES DRAWN AEROBIC AND ANAEROBIC Blood Culture adequate volume Performed at [facility], [address], [place], [place] [address]    Culture  Setup Time   Final    GRAM NEGATIVE RODS Gram Stain Report Called to,Read Back By and Verified With: THOMAS,K. AT 0107 ON 01/26/2018 BY EVA IN BOTH AEROBIC AND ANAEROBIC BOTTLES Performed at  [place], READ BACK BY AND VERIFIED WITH: RN Carlye Grippe [phone] FCP Performed at [facility], [place] [address] [place],  [address]    Culture KLEBSIELLA PNEUMONIAE (A)  Final   Report Status 01/28/2018 FINAL  Final   Organism ID, Bacteria KLEBSIELLA PNEUMONIAE  Final      Susceptibility   Klebsiella pneumoniae - MIC*    AMPICILLIN >=32 RESISTANT Resistant     CEFAZOLIN <=4 SENSITIVE Sensitive     CEFEPIME <=1 SENSITIVE Sensitive     CEFTAZIDIME <=1 SENSITIVE Sensitive     CEFTRIAXONE <=1 SENSITIVE Sensitive     CIPROFLOXACIN <=0.25 SENSITIVE Sensitive     GENTAMICIN <=1 SENSITIVE Sensitive     IMIPENEM <=0.25 SENSITIVE Sensitive     TRIMETH/SULFA <=20 SENSITIVE Sensitive     AMPICILLIN/SULBACTAM 8 SENSITIVE Sensitive     PIP/TAZO <=4 SENSITIVE Sensitive     Extended ESBL NEGATIVE Sensitive     * KLEBSIELLA PNEUMONIAE  Blood Culture ID Panel (Reflexed)     Status: Abnormal   Collection Time: 02/03/2018 11:14 AM  Result Value Ref Range Status   Enterococcus species NOT DETECTED NOT DETECTED Final   Listeria monocytogenes NOT DETECTED NOT DETECTED Final   Staphylococcus species NOT DETECTED NOT DETECTED Final   Staphylococcus aureus (BCID) NOT DETECTED NOT DETECTED Final   Streptococcus species NOT DETECTED NOT DETECTED Final   Streptococcus agalactiae NOT DETECTED NOT DETECTED Final   Streptococcus pneumoniae NOT DETECTED NOT DETECTED Final   Streptococcus pyogenes NOT DETECTED NOT DETECTED Final   Acinetobacter baumannii NOT DETECTED NOT DETECTED Final   Enterobacteriaceae species DETECTED (A) NOT DETECTED Final    Comment: Enterobacteriaceae represent a large family of gram-negative bacteria, not a single organism. CRITICAL RESULT CALLED TO, READ BACK BY AND VERIFIED WITH: RN MOGAN TAYLOR [phone] FCP    Enterobacter cloacae complex NOT DETECTED NOT DETECTED Final   Escherichia coli NOT DETECTED NOT DETECTED Final    Klebsiella oxytoca NOT DETECTED NOT DETECTED Final   Klebsiella pneumoniae DETECTED (A) NOT DETECTED Final    Comment: CRITICAL RESULT CALLED TO, READ BACK BY AND VERIFIED WITH: RN [facility] TAYLOR [phone] FCP  Proteus species NOT DETECTED NOT DETECTED Final   Serratia marcescens NOT DETECTED NOT DETECTED Final   Carbapenem resistance NOT DETECTED NOT DETECTED Final   Haemophilus influenzae NOT DETECTED NOT DETECTED Final   Neisseria meningitidis NOT DETECTED NOT DETECTED Final   Pseudomonas aeruginosa NOT DETECTED NOT DETECTED Final   Candida albicans NOT DETECTED NOT DETECTED Final   Candida glabrata NOT DETECTED NOT DETECTED Final   Candida krusei NOT DETECTED NOT DETECTED Final   Candida parapsilosis NOT DETECTED NOT DETECTED Final   Candida tropicalis NOT DETECTED NOT DETECTED Final    Comment: Performed at [facility], [place] [address], [place], [place] [address]  Culture, blood (Routine X 2) w Reflex to ID Panel     Status: None (Preliminary result)   Collection Time: 02/08/2018  2:15 PM  Result Value Ref Range Status   Specimen Description BLOOD RIGHT WRIST  Final   Special Requests   Final    BOTTLES DRAWN AEROBIC AND ANAEROBIC Blood Culture adequate volume   Culture   Final    NO GROWTH 3 DAYS Performed at [facility], [address] [place], [place] [address]    Report Status PENDING  Incomplete     Studies: No results found. Scheduled Meds: . feeding supplement  1 Container Oral TID BM  . feeding supplement (PRO-STAT SUGAR FREE 64)  30 mL Oral TID  . linaclotide  290 mcg Oral QAC breakfast  . morphine  30 mg Oral Q12H  . oxyCODONE  20 mg Oral Q4H  . senna-docusate  2 tablet Oral BID  . sodium chloride flush  10-40 mL Intracatheter Q12H   Continuous Infusions: . cefTRIAXone (ROCEPHIN)  IV 2 g (01/28/18 1057)    Active Problems:   Ampullary carcinoma (HCC)   Chronic pain   Protein-calorie malnutrition, severe   Generalized weakness   Anemia in  neoplastic disease   Liver metastases (HCC)   Large Liver masses   GERD (gastroesophageal reflux disease)   Dehydration   Gram-negative bacteremia   Leukocytosis   Presumed Pulmonary embolus   Liver failure, acute   Elevated INR   UTI due to Klebsiella species  Time spent: 63mins  Kathie Dike, MD [facility] Pager [phone]  If 7PM-7AM, please contact night-coverage [email or web address] Password [email or web address] 01/28/2018, 4:34 PM    LOS: 3 days

## 2018-01-28 NOTE — Progress Notes (Signed)
Palliative Medicine  No charge  Brief note, full consult to follow-  Met with patient's spouse. Patient was sleeping, spouse preferred not to wake. Gave emotional support as she processed previous discussion with Dr. Roderic Palau. She is realistic in expectations of patient's prognosis. Discussed Hospice services. Will followup with patient and spouse tomorrow.  Mariana Kaufman, AGNP-C Palliative Medicine  Please call Palliative Medicine team phone with any questions [phone]. For individual providers please see AMION.

## 2018-01-29 ENCOUNTER — Encounter (HOSPITAL_COMMUNITY): Payer: Self-pay | Admitting: Radiology

## 2018-01-29 ENCOUNTER — Inpatient Hospital Stay (HOSPITAL_COMMUNITY): Payer: BLUE CROSS/BLUE SHIELD

## 2018-01-29 DIAGNOSIS — Z7189 Other specified counseling: Secondary | ICD-10-CM

## 2018-01-29 DIAGNOSIS — C259 Malignant neoplasm of pancreas, unspecified: Secondary | ICD-10-CM

## 2018-01-29 DIAGNOSIS — F4323 Adjustment disorder with mixed anxiety and depressed mood: Secondary | ICD-10-CM

## 2018-01-29 DIAGNOSIS — E440 Moderate protein-calorie malnutrition: Secondary | ICD-10-CM

## 2018-01-29 DIAGNOSIS — Z515 Encounter for palliative care: Secondary | ICD-10-CM

## 2018-01-29 DIAGNOSIS — K7200 Acute and subacute hepatic failure without coma: Secondary | ICD-10-CM

## 2018-01-29 LAB — CBC
HCT: 30.9 % — ABNORMAL LOW (ref 39.0–52.0)
Hemoglobin: 9 g/dL — ABNORMAL LOW (ref 13.0–17.0)
MCH: 27.7 pg (ref 26.0–34.0)
MCHC: 29.1 g/dL — ABNORMAL LOW (ref 30.0–36.0)
MCV: 95.1 fL (ref 80.0–100.0)
Platelets: 181 10*3/uL (ref 150–400)
RBC: 3.25 MIL/uL — ABNORMAL LOW (ref 4.22–5.81)
RDW: 26.1 % — ABNORMAL HIGH (ref 11.5–15.5)
WBC: 28.7 10*3/uL — ABNORMAL HIGH (ref 4.0–10.5)
nRBC: 0.1 % (ref 0.0–0.2)

## 2018-01-29 LAB — PROTIME-INR
INR: 3.42
Prothrombin Time: 34 seconds — ABNORMAL HIGH (ref 11.4–15.2)

## 2018-01-29 LAB — COMPREHENSIVE METABOLIC PANEL
ALT: 27 U/L (ref 0–44)
ANION GAP: 9 (ref 5–15)
AST: 91 U/L — ABNORMAL HIGH (ref 15–41)
Albumin: 1.6 g/dL — ABNORMAL LOW (ref 3.5–5.0)
Alkaline Phosphatase: 294 U/L — ABNORMAL HIGH (ref 38–126)
BUN: 14 mg/dL (ref 6–20)
CO2: 21 mmol/L — AB (ref 22–32)
Calcium: 8.7 mg/dL — ABNORMAL LOW (ref 8.9–10.3)
Chloride: 104 mmol/L (ref 98–111)
Creatinine, Ser: 0.3 mg/dL — ABNORMAL LOW (ref 0.61–1.24)
GFR calc Af Amer: >60 mL/min (ref >60)
GFR calc non Af Amer: >60 mL/min (ref >60)
Glucose, Bld: 84 mg/dL (ref 70–99)
Potassium: 4.6 mmol/L (ref 3.5–5.1)
SODIUM: 134 mmol/L — AB (ref 135–145)
Total Bilirubin: 3.8 mg/dL — ABNORMAL HIGH (ref 0.3–1.2)
Total Protein: 5.1 g/dL — ABNORMAL LOW (ref 6.5–8.1)

## 2018-01-29 MED ORDER — BISACODYL 10 MG RE SUPP: 10.0000 mg | Freq: Once | RECTAL | Status: DC

## 2018-01-29 MED ORDER — ALPRAZOLAM 0.25 MG PO TABS
0.2500 mg | ORAL_TABLET | ORAL | Status: AC
  Administered 2018-01-29: 0.25 mg via ORAL
  Filled 2018-01-29: qty 1

## 2018-01-29 MED ORDER — BISACODYL 10 MG RE SUPP
10.0000 mg | Freq: Once | RECTAL | Status: AC
  Administered 2018-01-29: 10 mg via RECTAL
  Filled 2018-01-29: qty 1

## 2018-01-29 MED ORDER — ALPRAZOLAM 0.25 MG PO TABS
0.2500 mg | ORAL_TABLET | Freq: Three times a day (TID) | ORAL | Status: DC | PRN
  Administered 2018-01-30 – 2018-01-31 (×2): 0.25 mg via ORAL
  Filled 2018-01-29 (×2): qty 1

## 2018-01-29 NOTE — Consult Note (Signed)
Consultation Note Date: 01/29/2018   Patient Name: Luis Frey  DOB: 1970-07-22  MRN: [id number]  Age / Sex: 47 y.o., male  PCP: Lavella Lemons, PA Referring Physician: Kathie Dike, MD  Reason for Consultation: Establishing goals of care  HPI/Patient Profile: 47 y.o. male  with past medical history of ampullary pancreatic cancer with mets to liver (s/p whipple and chemotherapy- last treated with Gemzar at [facility] in [place],) admitted on 01/30/2018 with increasing weakness and abdominal pain. CT scan shows large liver lesion- per spouse size is increased from last scan- no records available for comparison, labs indicate liver failure- albumin 1.7, PT 34, bili- 3.8. Patient septic with blood culture from [facility] and urine positive for klebsiella. Palliative medicine consulted for [employer].    Clinical Assessment and Goals of Care:  I have reviewed medical records including EPIC notes, labs and imaging, received report from Dr. Roderic Palau assessed the patient and then met at the bedside along with patient's spouse- to discuss diagnosis prognosis, GOC, EOL wishes, disposition and options.  Patient had just gone to sleep and his wife did not want to wake him. He and his spouse had just had a very in depth GOC discussion with Dr. Roderic Palau prior to my arrival.  I introduced Palliative Medicine as specialized medical care for people living with serious illness. It focuses on providing relief from the symptoms and stress of a serious illness. The goal is to improve quality of life for both the patient and the family.  We discussed a brief life review of the patient. He and wife have a 19 yr old son. The last year has really been focused on treating his cancer. They drive to [facility] for appointments.   As far as functional and nutritional status- prior to this admission patient had declined to the point that he  could not walk to the bathroom. He was using a bedside toilet. His spouse notes he's been sleeping more than awake. Eating very little.   We discussed her current illness and what it means in the larger context of her on-going co-morbidities.  Natural disease trajectory and expectations at EOL were discussed. Wife is very accepting and grieving over the fact that patient is at EOL and dying. Her hope is for patient to no longer suffer. She feels he has suffered greatly over the last year, starting with his diagnosis. She states that when they are alone he does talk about dying, he realizes he is dying.   I attempted to elicit values and goals of care important to the patient. His family is most important to him.  The difference between aggressive medical intervention and comfort care was considered in light of the patient's goals of care. Patients wife would definitely elect comfort care for patient. She inquired about hospice and we discussed Hospice services and philosophy of care. She plans to talk about this with patient.   Code status was previously discussed by Dr. Roderic Palau.   Hospice and Palliative Care services outpatient were  explained and offered.  Primary Decision Maker PATIENT and spouse    SUMMARY OF RECOMMENDATIONS -PMT plans to follow up mid afternoon with spouse and hopefully talk with patient further about [place] consult entered for emotional support    Code Status/Advance Care Planning:  DNR  Palliative Prophylaxis:   Frequent Pain Assessment  Additional Recommendations (Limitations, Scope, Preferences):  Full Scope Treatment  Psycho-social/Spiritual:   Desire for further Chaplaincy support:yes   Prognosis:    < 6 weeks  Discharge Planning: To Be Determined  Primary Diagnoses: Present on Admission: . Anemia in neoplastic disease . Protein-calorie malnutrition, severe . Chronic pain . Liver metastases ([facility]) . Large Liver masses . Ampullary  carcinoma ([place]) . GERD (gastroesophageal reflux disease) . Dehydration . Gram-negative bacteremia . Leukocytosis . Presumed Pulmonary embolus . Liver failure, acute . Elevated INR . UTI due to Klebsiella species   I have reviewed the medical record, interviewed the patient and family, and examined the patient. The following aspects are pertinent.  Past Medical History:  Diagnosis Date  . Ampullary carcinoma (HCC)    pancreatic   . Anxiety   . Aspiration pneumonia ([place])   . Chronic pain   . GERD (gastroesophageal reflux disease)   . History of kidney stones   . Pancreatic fluid leak 08/2016   s/p whipple   Social History   Socioeconomic History  . Marital status: Married    Spouse name: Not on file  . Number of children: Not on file  . Years of education: Not on file  . Highest education level: Not on file  Occupational History  . Not on file  Social Needs  . Financial resource strain: Not on file  . Food insecurity:    Worry: Not on file    Inability: Not on file  . Transportation needs:    Medical: Not on file    Non-medical: Not on file  Tobacco Use  . Smoking status: Former Smoker    Packs/day: 1.00    Years: 24.00    Pack years: 24.00    Types: Cigarettes    Last attempt to quit: 07/06/2011    Years since quitting: 6.5  . Smokeless tobacco: Former Systems developer    Types: Snuff    Quit date: 07/06/1982  Substance and Sexual Activity  . Alcohol use: No    Comment: socially , moonshine once every 3 months  . Drug use: Yes    Types: Marijuana  . Sexual activity: Not on file  Lifestyle  . Physical activity:    Days per week: Not on file    Minutes per session: Not on file  . Stress: Not on file  Relationships  . Social connections:    Talks on phone: Not on file    Gets together: Not on file    Attends religious service: Not on file    Active member of club or organization: Not on file    Attends meetings of clubs or organizations: Not on file     Relationship status: Not on file  Other Topics Concern  . Not on file  Social History Narrative  . Not on file   Family History  Problem Relation Age of Onset  . Liver disease Neg Hx    Scheduled Meds: . feeding supplement  1 Container Oral BID BM  . linaclotide  290 mcg Oral QAC breakfast  . morphine  30 mg Oral Q12H  . oxyCODONE  20 mg Oral Q4H  . protein supplement  1 scoop Oral TID WC  . senna-docusate  2 tablet Oral BID  . sodium chloride flush  10-40 mL Intracatheter Q12H   Continuous Infusions: . cefTRIAXone (ROCEPHIN)  IV 2 g (01/28/18 1057)   PRN Meds:.benzonatate, HYDROmorphone (DILAUDID) injection, ondansetron **OR** ondansetron (ZOFRAN) IV, sodium chloride flush Medications Prior to Admission:  Prior to Admission medications   Medication Sig Start Date End Date Taking? Authorizing Provider  ALPRAZolam Duanne Moron) 0.25 MG tablet Take 0.25 mg by mouth at bedtime as needed for anxiety.   Yes [provider]  docusate sodium (COLACE) 100 MG capsule Take 100 mg by mouth 2 (two) times daily as needed for mild constipation.   Yes [provider]  Oxycodone HCl 10 MG TABS Take 20 mg by mouth every 4 (four) hours.    Yes [provider]  pantoprazole (PROTONIX) 40 MG tablet Take 40 mg by mouth 2 (two) times daily.   Yes [provider]  CREON 24000-76000 units CPEP Take 3 capsules by mouth 3 (three) times daily with meals. 10/11/16   [provider]  morphine (MS CONTIN) 30 MG 12 hr tablet TAKE ONE TABLET BY MOUTH EVERY 12 HOURS FOR PAIN MANAGEMENT 03/11/17   [provider]   Allergies  Allergen Reactions  . Poison Oak Extract Itching, Swelling and Rash   Review of Systems  Unable to perform ROS   Physical Exam Vitals signs and nursing note reviewed.  Constitutional:      Appearance: He is ill-appearing.     Comments: sleeping  Skin:    Coloration: Skin is jaundiced.     Vital Signs: BP 120/81 (BP Location: Left  Arm)   Pulse (!) 123   Temp 97.7 F (36.5 C) (Oral)   Resp 17   Ht 5' 11" (1.803 m)   Wt 86.2 kg   SpO2 100%   BMI 26.50 kg/m  Pain Scale: 0-10 POSS *See Group Information*: 1-Acceptable,Awake and alert Pain Score: Asleep   SpO2: SpO2: 100 % O2 Device:SpO2: 100 % O2 Flow Rate: .O2 Flow Rate (L/min): 3 L/min  IO: Intake/output summary:   Intake/Output Summary (Last 24 hours) at 01/29/2018 0744 Last data filed at 01/29/2018 0630 Gross per 24 hour  Intake 2622.7 ml  Output 950 ml  Net 1672.7 ml    LBM: Last BM Date: 01/27/2018 Baseline Weight: Weight: 86.2 kg Most recent weight: Weight: 86.2 kg     Palliative Assessment/Data: PPS: 30%   Flowsheet Rows     Most Recent Value  Intake Tab  Referral Department  Hospitalist  Unit at Time of Referral  Med/Surg Unit  Palliative Care Primary Diagnosis  Cancer  Date Notified  01/28/18  Palliative Care Type  New Palliative care  Reason for referral  Pain  Date of Admission  01/15/2018  # of days IP prior to Palliative referral  3  Clinical Assessment  Psychosocial & Spiritual Assessment  Palliative Care Outcomes      Thank you for this consult. Palliative medicine will continue to follow and assist as needed.   Time In: 1200 Time Out: 1420 Time Total: 140 minutes Greater than 50%  of this time was spent counseling and coordinating care related to the above assessment and plan.  Signed by: Mariana Kaufman, AGNP-C Palliative Medicine    Please contact Palliative Medicine Team phone at [phone] for questions and concerns.  For individual provider: See Shea Evans

## 2018-01-29 NOTE — Progress Notes (Signed)
PROGRESS NOTE  Luis Frey  RDE:[id number]  DOB: 23-May-1970  DOA: 01/23/2018 PCP: Lavella Lemons, PA   Brief Admission Hx: 47 year old male presented to emergency room with generalized weakness and abdominal pain.  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  He was noted to have worsening of the liver metastasis and evidence of pancreatic metastasis on admission.  He had been treated at the cancer center treatment [facility] of [place] in [place].  He was admitted with a UTI.  MDM/Assessment & Plan:   1. 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  His CT scan is consistent with large liver metastases.  We do not have any recent CT scans to compare as he has been receiving scans and treatments at the [facility] in [place] but wife confirms that tumors are much larger now than in September.  The patient was started on supportive therapy.  Patient has very poor functional and nutritional status.  His long-term prognosis is poor.  Goals of care discussions were initiated with the patient and his wife.  His wife does appear to be understanding regarding his prognosis, but patient does not appear to be ready to stop treatment on his cancer.  He is not willing to accept hospice services at this time.  He wishes to continue active treatments.  He is having a difficult time coming to terms with his prognosis/mortality.  Palliative care and spiritual consult has been requested. 2. Klebsiella UTI- currently on ceftriaxone.  Follow blood cultures. 3. Klebsiella Gram negative rod bacteremia -continue ceftriaxone dose to 2 gm IV.  Follow C&S.  4. Presumed PE - V/Q scan along with clinical presentation is Highly suggestive of PE. The patient is in active liver failure and his INR>3 so no further anticoagulation ordered.  Continue supportive care.   5. Shortness of  breath-patient likely has a PE. Continue supplemental oxygen for comfort. Repeat chest xray today 6. 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  He was transfused 2 unit PRBC.  Follow-up hemoglobin has been stable at 8.7 7. Dehydration- discontinue further IV fluids since he is third spacing 8. GERD-Protonix for GI protection. 9. Chronic constipation-opioid induced- continue laxatives as ordered. Will give dulcolax suppositories. 10. Chronic pain related to metastatic cancer- reports fair control with MS Contin and frequent oxycodone. 11. Severe protein calorie malnutrition-patient has not been eating well in the last 2 weeks.  Nutrition following 12. Goals of care.  I had a long discussion with the patient and his wife.  We reviewed his current state of health, progression of disease and expected prognosis.  He wishes to continue to seek active treatment for his cancer.  He is unsure about hospice at this time.  Initially, when I talked about end-of-life, patient expressed his wishes to die at home without any aggressive measures.  DNR was put into place.  When this was readdressed by staff later on in the day, patient said that he would want everything done.  Clearly, he is having a difficult time coming to terms with his prognosis and mortality.  Palliative care consulted to help address goals of care. I had a separate conversation with the patient's wife. She relayed that patient has some reservations about hospice based on a previous experience with hospice and his grandmother. Wife is understanding of patient's prognosis and would prefer to take him home with  hospice services. She will discuss this further with him.  DVT Prophylaxis: Lovenox DC'd due to INR>3 Code Status: Full Family Communication:  Discussed with wife at the bedside Disposition Plan: To be determined  Consultants:  Palliative medicine  Antimicrobials:  Ceftriaxone 12/14 >   Subjective: Continues to  have abdominal pain. No vomiting, no bowel movements. He is passing flatus. Feels more short of breath today  Objective: Vitals:   01/28/18 1550 01/28/18 2105 01/28/18 2346 01/29/18 0554  BP: 121/77 122/78  120/81  Pulse: (!) 110 (!) 119 (!) 118 (!) 123  Resp: 18   17  Temp: 98.6 F (37 C)   97.7 F (36.5 C)  TempSrc: Oral   Oral  SpO2: 100% 100% 100% 100%  Weight:      Height:        Intake/Output Summary (Last 24 hours) at 01/29/2018 1840 Last data filed at 01/29/2018 0630 Gross per 24 hour  Intake 120 ml  Output 150 ml  Net -30 ml   Filed Weights   01/26/2018 0501  Weight: 86.2 kg   REVIEW OF SYSTEMS  As per history otherwise all reviewed and reported negative  Exam:  General exam: Alert, awake, no distress Respiratory system: Clear to auscultation. Respiratory effort normal. Cardiovascular system:RRR. No murmurs, rubs, gallops. Gastrointestinal system: Abdomen is distended, soft and diffusely tender. No organomegaly or masses felt. Normal bowel sounds heard. Central nervous system: . No focal neurological deficits. Extremities: 1+ edema bilaterally Skin: No rashes, lesions or ulcers Psychiatry: somnolent, having periods of confusion   Data Reviewed: Basic Metabolic Panel: Recent Labs  Lab 01/31/2018 0609 01/26/18 0732 01/27/18 0602 01/28/18 0527 01/29/18 0508  NA 131* 131* 134* 134* 134*  K 4.4 3.7 3.8 4.1 4.6  CL 96* 103 104 105 104  CO2 22 21* 21* 21* 21*  GLUCOSE 85 124* 89 95 84  BUN 17 13 11 12 14   CREATININE 0.59* 0.54* 0.40* 0.36* 0.30*  CALCIUM 8.2* 7.6* 8.1* 8.3* 8.7*  MG  --  1.6* 1.6*  --   --    Liver Function Tests: Recent Labs  Lab 02/10/2018 0609 01/26/18 0732 01/27/18 0602 01/28/18 0527 01/29/18 0508  AST 78* 83* 79* 77* 91*  ALT 25 24 24 26 27   ALKPHOS 385* 353* 377* 337* 294*  BILITOT 3.8* 2.8* 3.4* 3.2* 3.8*  PROT 5.5* 4.8* 4.9* 5.0* 5.1*  ALBUMIN 1.9* 1.7* 1.7* 1.7* 1.6*   No results for input(s): LIPASE, AMYLASE in the  last 168 hours. Recent Labs  Lab 02/03/2018 0609  AMMONIA 38*   CBC: Recent Labs  Lab 01/16/2018 0609 01/26/18 0838 01/27/18 0602 01/28/18 0527 01/29/18 0508  WBC 28.7* 24.2* 25.4* 27.7* 28.7*  NEUTROABS 23.1* 19.6* 19.9* 22.5*  --   HGB 7.6* 6.4* 8.7* 8.7* 9.0*  HCT 25.8* 22.4* 29.3* 29.4* 30.9*  MCV 90.8 92.6 89.6 91.3 95.1  PLT 313 242 207 193 181   Cardiac Enzymes: Recent Labs  Lab 01/17/2018 0609 02/04/2018 0953  CKTOTAL  --  21*  TROPONINI 0.06* 0.05*   CBG (last 3)  No results for input(s): GLUCAP in the last 72 hours. Recent Results (from the past 240 hour(s))  Urine Culture     Status: Abnormal   Collection Time: 01/16/2018 11:01 AM  Result Value Ref Range Status   Specimen Description   Final    URINE, CLEAN CATCH Performed at [facility], [address] [place], [place] [address]    Special Requests   Final  NONE Performed at [facility], [address], [place], [place] [address]    Culture >=100,000 COLONIES/mL KLEBSIELLA PNEUMONIAE (A)  Final   Report Status 01/27/2018 FINAL  Final   Organism ID, Bacteria KLEBSIELLA PNEUMONIAE (A)  Final      Susceptibility   Klebsiella pneumoniae - MIC*    AMPICILLIN >=32 RESISTANT Resistant     CEFAZOLIN <=4 SENSITIVE Sensitive     CEFTRIAXONE <=1 SENSITIVE Sensitive     CIPROFLOXACIN <=0.25 SENSITIVE Sensitive     GENTAMICIN <=1 SENSITIVE Sensitive     IMIPENEM <=0.25 SENSITIVE Sensitive     NITROFURANTOIN 128 RESISTANT Resistant     TRIMETH/SULFA <=20 SENSITIVE Sensitive     AMPICILLIN/SULBACTAM 4 SENSITIVE Sensitive     PIP/TAZO <=4 SENSITIVE Sensitive     Extended ESBL NEGATIVE Sensitive     * >=100,000 COLONIES/mL KLEBSIELLA PNEUMONIAE  Culture, blood (Routine X 2) w Reflex to ID Panel     Status: Abnormal   Collection Time: 01/27/2018 11:14 AM  Result Value Ref Range Status   Specimen Description   Final    PORTA CATH Performed at [facility], [address] [place], [place] [address]    Special  Requests   Final    BOTTLES DRAWN AEROBIC AND ANAEROBIC Blood Culture adequate volume Performed at [facility], [address] [place], [place] [address]    Culture  Setup Time   Final    GRAM NEGATIVE RODS Gram Stain Report Called to,Read Back By and Verified With: THOMAS,K. AT 0107 ON 01/26/2018 BY EVA IN BOTH AEROBIC AND ANAEROBIC BOTTLES Performed at [place], READ BACK BY AND VERIFIED WITH: RN Carlye Grippe 237 [phone] FCP Performed at [facility], [place] [address] [place],  [address]    Culture KLEBSIELLA PNEUMONIAE (A)  Final   Report Status 01/28/2018 FINAL  Final   Organism ID, Bacteria KLEBSIELLA PNEUMONIAE  Final      Susceptibility   Klebsiella pneumoniae - MIC*    AMPICILLIN >=32 RESISTANT Resistant     CEFAZOLIN <=4 SENSITIVE Sensitive     CEFEPIME <=1 SENSITIVE Sensitive     CEFTAZIDIME <=1 SENSITIVE Sensitive     CEFTRIAXONE <=1 SENSITIVE Sensitive     CIPROFLOXACIN <=0.25 SENSITIVE Sensitive     GENTAMICIN <=1 SENSITIVE Sensitive     IMIPENEM <=0.25 SENSITIVE Sensitive     TRIMETH/SULFA <=20 SENSITIVE Sensitive     AMPICILLIN/SULBACTAM 8 SENSITIVE Sensitive     PIP/TAZO <=4 SENSITIVE Sensitive     Extended ESBL NEGATIVE Sensitive     * KLEBSIELLA PNEUMONIAE  Blood Culture ID Panel (Reflexed)     Status: Abnormal   Collection Time: 01/24/2018 11:14 AM  Result Value Ref Range Status   Enterococcus species NOT DETECTED NOT DETECTED Final   Listeria monocytogenes NOT DETECTED NOT DETECTED Final   Staphylococcus species NOT DETECTED NOT DETECTED Final   Staphylococcus aureus (BCID) NOT DETECTED NOT DETECTED Final   Streptococcus species NOT DETECTED NOT DETECTED Final   Streptococcus agalactiae NOT DETECTED NOT DETECTED Final   Streptococcus pneumoniae NOT DETECTED NOT DETECTED Final   Streptococcus pyogenes NOT DETECTED NOT DETECTED Final   Acinetobacter baumannii NOT DETECTED NOT DETECTED Final   Enterobacteriaceae  species DETECTED (A) NOT DETECTED Final    Comment: Enterobacteriaceae represent a large family of gram-negative bacteria, not a single organism. CRITICAL RESULT CALLED TO, READ BACK BY AND VERIFIED WITH: RN [facility] TAYLOR 729 [phone] FCP    Enterobacter  cloacae complex NOT DETECTED NOT DETECTED Final   Escherichia coli NOT DETECTED NOT DETECTED Final   Klebsiella oxytoca NOT DETECTED NOT DETECTED Final   Klebsiella pneumoniae DETECTED (A) NOT DETECTED Final    Comment: CRITICAL RESULT CALLED TO, READ BACK BY AND VERIFIED WITH: RN [facility] TAYLOR [phone] FCP    Proteus species NOT DETECTED NOT DETECTED Final   Serratia marcescens NOT DETECTED NOT DETECTED Final   Carbapenem resistance NOT DETECTED NOT DETECTED Final   Haemophilus influenzae NOT DETECTED NOT DETECTED Final   Neisseria meningitidis NOT DETECTED NOT DETECTED Final   Pseudomonas aeruginosa NOT DETECTED NOT DETECTED Final   Candida albicans NOT DETECTED NOT DETECTED Final   Candida glabrata NOT DETECTED NOT DETECTED Final   Candida krusei NOT DETECTED NOT DETECTED Final   Candida parapsilosis NOT DETECTED NOT DETECTED Final   Candida tropicalis NOT DETECTED NOT DETECTED Final    Comment: Performed at [facility], [place] [address] [place], [place] [address]  Culture, blood (Routine X 2) w Reflex to ID Panel     Status: None (Preliminary result)   Collection Time: 01/30/2018  2:15 PM  Result Value Ref Range Status   Specimen Description BLOOD RIGHT WRIST  Final   Special Requests   Final    BOTTLES DRAWN AEROBIC AND ANAEROBIC Blood Culture adequate volume   Culture   Final    NO GROWTH 4 DAYS Performed at [facility], [address] [place], [place] [address]    Report Status PENDING  Incomplete     Studies: No results found. Scheduled Meds: . bisacodyl  10 mg Rectal Once  . feeding supplement  1 Container Oral BID BM  . linaclotide  290 mcg Oral QAC breakfast  . morphine  30 mg Oral Q12H  . oxyCODONE  20 mg  Oral Q4H  . protein supplement  1 scoop Oral TID WC  . senna-docusate  2 tablet Oral BID  . sodium chloride flush  10-40 mL Intracatheter Q12H   Continuous Infusions: . cefTRIAXone (ROCEPHIN)  IV 2 g (01/29/18 1148)    Active Problems:   Ampullary carcinoma (HCC)   Chronic pain   Protein-calorie malnutrition, severe   Generalized weakness   Anemia in neoplastic disease   Liver metastases (HCC)   Large Liver masses   GERD (gastroesophageal reflux disease)   Dehydration   Gram-negative bacteremia   Leukocytosis   Presumed Pulmonary embolus   Liver failure, acute   Elevated INR   UTI due to Klebsiella species   Malnutrition of moderate degree   Goals of care, counseling/discussion   Advanced care planning/counseling discussion   Palliative care by specialist   Malignant neoplasm of pancreas ([place])   Adjustment disorder with mixed anxiety and depressed mood  Time spent: 43mins  Kathie Dike, MD [facility] Pager [phone]  If 7PM-7AM, please contact night-coverage [email or web address] Password [email or web address] 01/29/2018, 6:40 PM    LOS: 4 days

## 2018-01-29 NOTE — Progress Notes (Signed)
Daily Progress Note   Patient Name: Luis Frey       Date: 01/29/2018 DOB: 1970-05-08  Age: 47 y.o. MRN#: [id number] Attending Physician: Kathie Dike, MD Primary Care Physician: Lavella Lemons, [place] Admit Date: 02/09/2018  Reason for Consultation/Follow-up: Establishing goals of care  Subjective: Met with patient and wife Luis Frey. Patient stated he was "uncomfortable". He described overall discomfort, not able to get into comfortable position with very enlarged abdomen. Denied pain specifically. He notes that he takes xanax at home.  I discussed patient's clinical status and EOL expectations with patient and spouse.  Patient specifically wanted to discuss the meaning and process of dying. He acknowledged at one point in the conversation that he is dying. When asked how he wanted to experience his last moment of death he stated quietly, just go to sleep with just his family around him.  We discussed Hospice. At first he adamantly stated no help from Hospice- he stated this was because he didn't want "to be surrounded by a bunch of strangers". I described to him how Hospice functions and the support that could be provided to his spouse and son and he seemed more receptive.  We discussed code status. He states he would like DNR status at home, but not in hospital. I could not elicit the reason for the difference. He then asked me, "Am I that close, could something happen here in the hospital?" I let him know that my hope is that it would not, but my worry is that Yes, it could.  At that point patient looked at his wife and said- "Well, then let's go, I don't want it to happen here"...his wife replied "you're not stable enough".  My concern is that patient will not stabilize any further than he is  now, and will possibly decline more- his WBC is continuing to climb, his albumin is decreasing and PT is increased today. He is not eating. He is unable to pull himself up in bed or ambulate.  Patient became upset during discussion of code status and asked for no further discussion. He stated talking makes him tired and he had too many visitors. He and spouse also asked to delay visit from Chaplain until tomorrow.    Review of Systems  Constitutional: Positive for malaise/fatigue and weight loss.  Respiratory:  Positive for cough.     Length of Stay: 4  Current Medications: Scheduled Meds:  . feeding supplement  1 Container Oral BID BM  . linaclotide  290 mcg Oral QAC breakfast  . morphine  30 mg Oral Q12H  . oxyCODONE  20 mg Oral Q4H  . protein supplement  1 scoop Oral TID WC  . senna-docusate  2 tablet Oral BID  . sodium chloride flush  10-40 mL Intracatheter Q12H    Continuous Infusions: . cefTRIAXone (ROCEPHIN)  IV 2 g (01/29/18 1148)    PRN Meds: ALPRAZolam, benzonatate, HYDROmorphone (DILAUDID) injection, ondansetron **OR** ondansetron (ZOFRAN) IV, sodium chloride flush  Physical Exam          Vital Signs: BP 120/81 (BP Location: Left Arm)   Pulse (!) 123   Temp 97.7 F (36.5 C) (Oral)   Resp 17   Ht _0  (1.803 m)   Wt 86.2 kg   SpO2 100%   BMI 26.50 kg/m  SpO2: SpO2: 100 % O2 Device: O2 Device: Nasal Cannula O2 Flow Rate: O2 Flow Rate (L/min): 3 L/min  Intake/output summary:   Intake/Output Summary (Last 24 hours) at 01/29/2018 1553 Last data filed at 01/29/2018 0630 Gross per 24 hour  Intake 120 ml  Output 150 ml  Net -30 ml   LBM: Last BM Date: 01/12/2018 Baseline Weight: Weight: 86.2 kg Most recent weight: Weight: 86.2 kg       Palliative Assessment/Data: PPS: 20%    Flowsheet Rows     Most Recent Value  Intake Tab  Referral Department  Hospitalist  Unit at Time of Referral  Med/Surg Unit  Palliative Care Primary Diagnosis  Cancer  Date  Notified  01/28/18  Palliative Care Type  New Palliative care  Reason for referral  Pain  Date of Admission  01/18/2018  # of days IP prior to Palliative referral  3  Clinical Assessment  Psychosocial & Spiritual Assessment  Palliative Care Outcomes      Patient Active Problem List   Diagnosis Date Noted  . Malnutrition of moderate degree 01/29/2018  . Goals of care, counseling/discussion   . Advanced care planning/counseling discussion   . Palliative care by specialist   . Malignant neoplasm of pancreas ([place])   . Gram-negative bacteremia 01/26/2018  . Leukocytosis 01/26/2018  . Presumed Pulmonary embolus 01/26/2018  . Liver failure, acute 01/26/2018  . Elevated INR 01/26/2018  . UTI due to Klebsiella species 01/26/2018  . Generalized weakness 02/11/2018  . Anemia in neoplastic disease 01/26/2018  . Liver metastases ([place]) 01/22/2018  . Large Liver masses 02/05/2018  . GERD (gastroesophageal reflux disease) 01/17/2018  . Dehydration 01/14/2018  . Aspiration into airway 10/29/2016  . Sepsis ([place]) 10/29/2016  . Ampullary carcinoma ([place]) 10/29/2016  . Chronic pain 10/29/2016  . Protein-calorie malnutrition, severe 10/29/2016  . Dilated bile duct 07/05/2016  . Biliary obstruction 07/05/2016  . Abnormal LFTs 03/21/2016  . Anemia 03/21/2016  . Melena 03/21/2016    Palliative Care Assessment & Plan   Patient Profile: 47 y.o. male  with past medical history of ampullary pancreatic cancer with mets to liver (s/p whipple and chemotherapy- last treated with Gemzar at [facility] in [place],) admitted on 01/29/2018 with increasing weakness and abdominal pain. CT scan shows large liver lesion- per spouse size is increased from last scan- no records available for comparison, labs indicate liver failure- albumin 1.7, PT 34, bili- 3.8. Patient septic with blood culture from [facility] and urine positive for  klebsiella. Palliative medicine consulted for [place].      Assessment/Recommendations/Plan   Xanax .62m TID for anxiety  Continue to treat what is treatable  Patient appears to understand he is at EOL, (see above discussion)- PMT will continue to discuss GFrostproofwith him and spouse  Goals of Care and Additional Recommendations:  Limitations on Scope of Treatment: Full Scope Treatment  Code Status:  Full code  Prognosis:   < 4 weeks- this is generous- I am concerned he may be progressing quite rapidly  Discharge Planning:  To Be Determined  Care plan was discussed with patient and spouse.  Thank you for allowing the Palliative Medicine Team to assist in the care of this patient.   Time In: 1100 Time Out: 1200 Total Time 60 minutes Prolonged Time Billed Yes      Greater than 50%  of this time was spent counseling and coordinating care related to the above assessment and plan.  KMariana Kaufman AGNP-C Palliative Medicine   Please contact Palliative Medicine Team phone at [phone] questions and concerns.

## 2018-01-30 DIAGNOSIS — Z7189 Other specified counseling: Secondary | ICD-10-CM

## 2018-01-30 DIAGNOSIS — C259 Malignant neoplasm of pancreas, unspecified: Secondary | ICD-10-CM

## 2018-01-30 DIAGNOSIS — F4323 Adjustment disorder with mixed anxiety and depressed mood: Secondary | ICD-10-CM

## 2018-01-30 DIAGNOSIS — Z515 Encounter for palliative care: Secondary | ICD-10-CM

## 2018-01-30 DIAGNOSIS — R160 Hepatomegaly, not elsewhere classified: Secondary | ICD-10-CM

## 2018-01-30 LAB — COMPREHENSIVE METABOLIC PANEL
ALT: 33 U/L (ref 0–44)
AST: 130 U/L — ABNORMAL HIGH (ref 15–41)
Albumin: 1.7 g/dL — ABNORMAL LOW (ref 3.5–5.0)
Alkaline Phosphatase: 245 U/L — ABNORMAL HIGH (ref 38–126)
Anion gap: 16 — ABNORMAL HIGH (ref 5–15)
BUN: 27 mg/dL — ABNORMAL HIGH (ref 6–20)
CO2: 15 mmol/L — ABNORMAL LOW (ref 22–32)
Calcium: 9 mg/dL (ref 8.9–10.3)
Chloride: 104 mmol/L (ref 98–111)
Creatinine, Ser: 0.58 mg/dL — ABNORMAL LOW (ref 0.61–1.24)
GFR calc Af Amer: >60 mL/min (ref >60)
GFR calc non Af Amer: >60 mL/min (ref >60)
Glucose, Bld: 93 mg/dL (ref 70–99)
Potassium: 5.4 mmol/L — ABNORMAL HIGH (ref 3.5–5.1)
Sodium: 135 mmol/L (ref 135–145)
Total Bilirubin: 3.9 mg/dL — ABNORMAL HIGH (ref 0.3–1.2)
Total Protein: 5.2 g/dL — ABNORMAL LOW (ref 6.5–8.1)

## 2018-01-30 LAB — CBC
HCT: 32.4 % — ABNORMAL LOW (ref 39.0–52.0)
Hemoglobin: 9.4 g/dL — ABNORMAL LOW (ref 13.0–17.0)
MCH: 27.4 pg (ref 26.0–34.0)
MCHC: 29 g/dL — ABNORMAL LOW (ref 30.0–36.0)
MCV: 94.5 fL (ref 80.0–100.0)
Platelets: 185 10*3/uL (ref 150–400)
RBC: 3.43 MIL/uL — ABNORMAL LOW (ref 4.22–5.81)
RDW: 26.8 % — ABNORMAL HIGH (ref 11.5–15.5)
WBC: 33.6 10*3/uL — ABNORMAL HIGH (ref 4.0–10.5)
nRBC: 0.3 % — ABNORMAL HIGH (ref 0.0–0.2)

## 2018-01-30 LAB — CULTURE, BLOOD (ROUTINE X 2)
Culture: NO GROWTH
Special Requests: ADEQUATE

## 2018-01-30 MED ORDER — MORPHINE SULFATE (PF) 4 MG/ML IV SOLN
4.0000 mg | INTRAVENOUS | Status: AC
  Administered 2018-01-30: 4 mg via INTRAVENOUS
  Filled 2018-01-30: qty 1

## 2018-01-30 MED ORDER — MORPHINE SULFATE (PF) 4 MG/ML IV SOLN: 4.0000 mg | INTRAVENOUS | Status: DC | PRN

## 2018-01-30 MED ORDER — MORPHINE 100MG IN NS 100ML (1MG/ML) PREMIX INFUSION
4.0000 mg/h | INTRAVENOUS | Status: DC
  Administered 2018-01-30 – 2018-01-31 (×2): 4 mg/h via INTRAVENOUS
  Filled 2018-01-30 (×2): qty 100

## 2018-01-30 MED ORDER — SODIUM BICARBONATE 8.4 % IV SOLN
INTRAVENOUS | Status: DC
  Administered 2018-01-30: 12:00:00 via INTRAVENOUS
  Filled 2018-01-30 (×3): qty 1000

## 2018-01-30 MED ORDER — SALINE SPRAY 0.65 % NA SOLN
1.0000 | NASAL | Status: DC | PRN
  Filled 2018-01-30: qty 44

## 2018-01-30 NOTE — Care Management Note (Signed)
Case Management Note  Patient Details  Name: Luis Frey MRN: [id number] Date of Birth: [birth year]   If discussed at Long Length of Stay Meetings, dates discussed:  01/30/18  Additional Comments:  Kahleb Mcclane, Chauncey [place], RN 01/30/2018, 12:06 PM

## 2018-01-30 NOTE — Progress Notes (Signed)
Daily Progress Note   Patient Name: Luis Frey       Date: 01/30/2018 DOB: February 11, 1971  Age: 47 y.o. MRN#: [id number] Attending Physician: Kathie Dike, MD Primary Care Physician: Lavella Lemons, [place] Admit Date: 02/07/2018  Reason for Consultation/Follow-up: Establishing goals of care  Subjective: Patient in bed, lethargic. States he is tired of talking- wants his wife to talk and make decisions for him.  Discussed with Dr. Roderic Palau. Agree that patient is not improving with this hospitalization, patient is dying despite all interventions. If his wish is to die at home- he would be best served by being discharged as soon as possible with Hospice support in place.  Lenor Coffin has been in discussion with Dr. Roderic Palau- decision made to take patient home with Hospice and allow for natural death at home.   ROS  Length of Stay: 5  Current Medications: Scheduled Meds:  . feeding supplement  1 Container Oral BID BM  . linaclotide  290 mcg Oral QAC breakfast  . morphine  30 mg Oral Q12H  . oxyCODONE  20 mg Oral Q4H  . protein supplement  1 scoop Oral TID WC  . senna-docusate  2 tablet Oral BID  . sodium chloride flush  10-40 mL Intracatheter Q12H    Continuous Infusions: . dextrose 5 % 1,000 mL with sodium bicarbonate 150 mEq infusion 100 mL/hr at 01/30/18 1134    PRN Meds: ALPRAZolam, benzonatate, HYDROmorphone (DILAUDID) injection, ondansetron **OR** ondansetron (ZOFRAN) IV, sodium chloride flush  Physical Exam Vitals signs and nursing note reviewed.  Constitutional:      Appearance: He is ill-appearing and toxic-appearing.  Cardiovascular:     Rate and Rhythm: Tachycardia present.  Pulmonary:     Effort: Pulmonary effort is normal.  Skin:    Coloration: Skin is jaundiced.    Neurological:     Motor: Weakness present.     Comments: lethargic             Vital Signs: BP (!) 112/59 (BP Location: Left Arm)   Pulse (!) 125   Temp 97.7 F (36.5 C) (Oral)   Resp 16   Ht 5\' 11"  (1.803 m)   Wt 86.2 kg   SpO2 100%   BMI 26.50 kg/m  SpO2: SpO2: 100 % O2 Device: O2 Device: Nasal Cannula O2 Flow Rate:  O2 Flow Rate (L/min): 4 L/min  Intake/output summary:   Intake/Output Summary (Last 24 hours) at 01/30/2018 1249 Last data filed at 01/29/2018 1844 Gross per 24 hour  Intake 480 ml  Output -  Net 480 ml   LBM: Last BM Date: 01/30/18 Baseline Weight: Weight: 86.2 kg Most recent weight: Weight: 86.2 kg       Palliative Assessment/Data: PPS: 20%   Flowsheet Rows     Most Recent Value  Intake Tab  Referral Department  Hospitalist  Unit at Time of Referral  Med/Surg Unit  Palliative Care Primary Diagnosis  Cancer  Date Notified  01/28/18  Palliative Care Type  New Palliative care  Reason for referral  Pain  Date of Admission  01/22/2018  # of days IP prior to Palliative referral  3  Clinical Assessment  Psychosocial & Spiritual Assessment  Palliative Care Outcomes      Patient Active Problem List   Diagnosis Date Noted  . Malnutrition of moderate degree 01/29/2018  . Goals of care, counseling/discussion   . Advanced care planning/counseling discussion   . Palliative care by specialist   . Malignant neoplasm of pancreas ([place])   . Adjustment disorder with mixed anxiety and depressed mood   . Gram-negative bacteremia 01/26/2018  . Leukocytosis 01/26/2018  . Presumed Pulmonary embolus 01/26/2018  . Liver failure, acute 01/26/2018  . Elevated INR 01/26/2018  . UTI due to Klebsiella species 01/26/2018  . Generalized weakness 02/08/2018  . Anemia in neoplastic disease 01/18/2018  . Liver metastases ([place]) 01/28/2018  . Large Liver masses 01/13/2018  . GERD (gastroesophageal reflux disease) 02/03/2018  . Dehydration 01/31/2018  . Aspiration  into airway 10/29/2016  . Sepsis ([place]) 10/29/2016  . 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 Chronic pain 10/29/2016  . Protein-calorie malnutrition, severe 10/29/2016  . Dilated bile duct 07/05/2016  . Biliary obstruction 07/05/2016  . Abnormal LFTs 03/21/2016  . Anemia 03/21/2016  . Melena 03/21/2016    Palliative Care Assessment & Plan   Patient Profile: 46 y.o. 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 CT scan shows large liver lesion- per spouse size is increased from last scan- no records available for comparison, labs indicate liver failure- albumin 1.7, PT 34, bili- 3.8. Patient septic with blood culture from [facility] and urine positive for klebsiella. Palliative medicine consulted for [place].     Assessment/Recommendations/Plan    D/C home with Hospice- I spoke with [facility] patient does not have a primary physician and Oncologist [place]- they said that their Medical Provider will often be attending when a patient does not have an adequate provider to attend  Recommend continuing patient's current scheduled pain and prn medications  Recommend Add Miralax 17gm 2 times daily until BM for opioid induced constipation  Goals of Care and Additional Recommendations:  Limitations on Scope of Treatment: Full Comfort Care  Code Status:  DNR  Prognosis:   < 2 weeks  Discharge Planning:  Home with Hospice  Care plan was discussed with patient's spouse, Dr. Roderic Palau and Josephina Gip.   Thank you for allowing the Palliative Medicine Team to assist in the care of this patient.   Time In: 1125 Time Out: 1200 Total Time 35 minutes Prolonged Time Billed NO      Greater than 50%  of this time was spent counseling and coordinating care  related to the above assessment and plan.  Mariana Kaufman, AGNP-C Palliative Medicine   Please contact Palliative Medicine Team phone at [phone] for questions and concerns.

## 2018-01-30 NOTE — Care Management (Signed)
Per attending, patient has elected hospice services at home. Palliative NP has spoken with [facility]. Referral called to [facility] and faxed.  Patient will need hospital bed and oxygen.  Vito Backers will reach out to patient's wife.  Anticipate DC home tomorrow after equipment has been delivered.

## 2018-01-30 NOTE — Care Management (Addendum)
Patient Information   Patient Name Luis Frey, Luis Frey ([id number]) Sex Male DOB [birth year]  Room Bed  A332 A332-01  Patient Demographics   Address [place] [address] Phone [phone] [facility]) [phone] (Mobile) E-mail Address [email or web address]  Patient Ethnicity & Race   Ethnic Group Patient Race  Not Hispanic or Latino White or Caucasian  Emergency Contact(s)   Name Relation Home Work Mobile  Eliot,Tauna Spouse [phone]    Documents on File    Status Date Received Description  Documents for the Patient  [place] Received 07/22/12   [place] E-Signature HIPAA Notice of Privacy Received 67/89/38   Driver's License Received 11/28/49   Insurance Card Received 07/22/12   Advance Directives/Living Will/HCPOA/POA Not Received    Insurance Card Not Received    Driver's License Not Received    Other Photo ID Not Received    Release of Information   RGA  HIM ROI Authorization  03/22/16   AMB Correspondence  03/15/16 OFFICE NOTE [facility] Record  03/08/16 D/S [facility] Card Received 07/05/16 [facility]  HIM ROI Authorization  07/06/16   AMB Outside ED Note  07/02/16 [place]  07/12/16 [facility]  HIM ROI Authorization  07/12/16 HIM...[facility] [employer] AND FAXED  HIM ROI Authorization  07/13/16 HIM [facility]  HIM ROI Authorization  07/16/16   AMB Correspondence  07/12/16 RX [facility]  HIM ROI Authorization  01/25/17 [facility]  HIM ROI Authorization  01/28/17   Insurance Card   BCBS 2019  Patient Photo   Photo of Patient  HIM Release of Information Output  01/25/17 Abstract - No Scans, ED Provider Note, Diagnostic Imaging Reports and Images  HIM Release of Information Output  01/25/17 Abstract - No Scans, ED Provider Note, Diagnostic  Imaging Reports and Images, Notes Individual  Documents for the Encounter  AOB (Assignment of Insurance Benefits) Not Received    E-signature AOB Signed 02/04/2018   MEDICARE RIGHTS Not Received    E-signature Medicare Rights     ED Patient Billing Extract   ED PB Billing Extract  EMS Run Sheet Received 01/26/18   EKG Received 01/27/18   Admission Information   Current Information   Attending Provider Admitting Provider Admission Type Admission Status  Kathie Dike, MD Luis Iba, MD Emergency Admission (Confirmed)       Admission Date/Time Discharge Date Hospital Service Auth/Cert Status  02/58/52 04:58 AM  Internal Medicine [place] [facility] Room/Bed   [facility] AP-DEPT 300 [phone]        Admission   Complaint  weakness  Hospital Account   Name Acct ID Class Status Primary Coverage  Luis Frey, Luis Frey [id number] Inpatient Open [place]      Guarantor Account (for Hospital Account [id number])   Name Relation to Pt Service Area Active? Acct Type  Luis Frey Self [facility] Yes Personal/Family  Address Phone    [address] [place], [place] [address] [phone])        Coverage Information (for Hospital Account [id number])   F/O Payor/Plan Precert #  [facility] SHIELD/BCBS OTHER   Subscriber Subscriber #  [place], [place] [id number]  Address Phone  [address] [place], [place] [address]    [place] # [phone]

## 2018-01-30 NOTE — Progress Notes (Signed)
PROGRESS NOTE  Luis Frey  UYQ:[id number]  DOB: 1970/03/01  DOA: 02/05/2018 PCP: Lavella Lemons, PA   Brief Admission Hx: 47 year old male presented to emergency room with generalized weakness and abdominal pain.  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  He was noted to have worsening of the liver metastasis and evidence of pancreatic metastasis on admission.  He had been treated at the cancer center [facility] [place] in [place].  He was admitted with a UTI.  MDM/Assessment & Plan:   1. 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  His CT scan is consistent with large liver metastases.  We do not have any recent CT scans to compare as he has been receiving scans and treatments at the [facility] in [place] but wife confirms that tumors are much larger now than in September.  The patient was started on supportive therapy.  Patient has very poor functional and nutritional status.  His long-term prognosis is poor.  Palliative care assisting with goals of care management. 2. Klebsiella UTI- treated with ceftriaxone.  3. Klebsiella Gram negative rod bacteremia -he was receiving intravenous ceftriaxone.  This has been discontinued since care has been transitioned to comfort measures 4. Presumed PE - V/Q scan along with clinical presentation is Highly suggestive of PE. The patient is in active liver failure and his INR>3 so no further anticoagulation ordered.  Continue supportive care.   5. Shortness of breath-patient likely has a PE. Continue supplemental oxygen for comfort. Repeat chest xray today 6. 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  He was transfused 2 unit PRBC.  Follow-up hemoglobin has been stable at 8.7 7. Dehydration- discontinue further IV fluids since he is third spacing 8. GERD-Protonix for GI  protection. 9. Chronic constipation-opioid induced- continue laxatives as ordered.  He has had bowel movements with Dulcolax suppositories 10. Chronic pain related to metastatic cancer- currently on MS Contin and oxycodone as well as as needed Dilaudid.  He has been transitioned to morphine infusion based on recommendations from hospice 11. Severe protein calorie malnutrition-patient has not been eating well in the last 2 weeks.  Nutrition following 12. Goals of care.  Frequent discussions have been had regarding goals of care, prognosis and end-of-life issues with the patient's wife.  Chaplain and palliative care have been extremely helpful.  Patient is having a very difficult time coming to terms with his prognosis.  He has delegated all decisions to his wife and and has communicated his wishes to her in the past.  After several discussions with the patient's wife, she would agree with DNR status and wants to keep the patient comfortable as possible.  She is elected hospice services.  Patient wishes to return home with hospice.  Arrangements are being made to have a hospital bed and oxygen delivered to home.  Patient was seen by hospice director, Dr. Sonny Dandy.  His recommendations were to simplify the patient's pain medication regimen to a morphine infusion.  His oral medications have been discontinued and he has been started on a morphine infusion which can be titrated as needed.  He is currently on 4 mg/h.  If he requires more than 2 boluses within 1 hour, his maintenance rate can be increased by 2 mg/h.  This can be continued at home as well.  We will need to ensure that patient's pain is  controlled prior to discharge.  DVT Prophylaxis: Lovenox DC'd due to INR>3 Code Status:  DNR Family Communication:  Discussed with wife outside the room.  Patient has empowered his wife to make all decisions regarding his care. Disposition Plan:  Discharge home with hospice services  Consultants:  Palliative  medicine  Antimicrobials:  Ceftriaxone 12/14 > 12/18  Subjective: Patient received suppository overnight had 2 bowel movements.  He is mildly confused today, but feels that his pain is controlled.  He has been having a productive cough.  Long conversation with the patient's wife outside of the room.  Chaplain and palliative care present for conversation.  Objective: Vitals:   01/29/18 0554 01/29/18 2011 01/29/18 2154 01/30/18 0529  BP: 120/81  108/65 (!) 112/59  Pulse: (!) 123  (!) 122 (!) 125  Resp: 17  17 16   Temp: 97.7 F (36.5 C)     TempSrc: Oral     SpO2: 100% 99% 100% 100%  Weight:      Height:        Intake/Output Summary (Last 24 hours) at 01/30/2018 1756 Last data filed at 01/30/2018 1500 Gross per 24 hour  Intake 396.69 ml  Output -  Net 396.69 ml   Filed Weights   01/21/2018 0501  Weight: 86.2 kg   Exam:  General exam: Patient is awake, having periods of lethargy, confused at times Respiratory system: Diminished breath sounds at bases.  Mild increased respiratory effort. Cardiovascular system: S1, S2, tachycardic. No murmurs, rubs, gallops. Gastrointestinal system: Abdomen is distended, soft and diffusely tender. No organomegaly or masses felt. Normal bowel sounds heard. Central nervous system: No focal neurological deficits. Extremities: 1-2+ edema bilaterally Skin: No rashes, lesions or ulcers Psychiatry: Somnolent, confused    Data Reviewed: Basic Metabolic Panel: Recent Labs  Lab 01/26/18 0732 01/27/18 0602 01/28/18 0527 01/29/18 0508 01/30/18 0554  NA 131* 134* 134* 134* 135  K 3.7 3.8 4.1 4.6 5.4*  CL 103 104 105 104 104  CO2 21* 21* 21* 21* 15*  GLUCOSE 124* 89 95 84 93  BUN 13 11 12 14  27*  CREATININE 0.54* 0.40* 0.36* 0.30* 0.58*  CALCIUM 7.6* 8.1* 8.3* 8.7* 9.0  MG 1.6* 1.6*  --   --   --    Liver Function Tests: Recent Labs  Lab 01/26/18 0732 01/27/18 0602 01/28/18 0527 01/29/18 0508 01/30/18 0554  AST 83* 79* 77* 91* 130*   ALT 24 24 26 27  33  ALKPHOS 353* 377* 337* 294* 245*  BILITOT 2.8* 3.4* 3.2* 3.8* 3.9*  PROT 4.8* 4.9* 5.0* 5.1* 5.2*  ALBUMIN 1.7* 1.7* 1.7* 1.6* 1.7*   No results for input(s): LIPASE, AMYLASE in the last 168 hours. Recent Labs  Lab 01/31/2018 0609  AMMONIA 38*   CBC: Recent Labs  Lab 02/05/2018 0609 01/26/18 0838 01/27/18 0602 01/28/18 0527 01/29/18 0508 01/30/18 0554  WBC 28.7* 24.2* 25.4* 27.7* 28.7* 33.6*  NEUTROABS 23.1* 19.6* 19.9* 22.5*  --   --   HGB 7.6* 6.4* 8.7* 8.7* 9.0* 9.4*  HCT 25.8* 22.4* 29.3* 29.4* 30.9* 32.4*  MCV 90.8 92.6 89.6 91.3 95.1 94.5  PLT 313 242 207 193 181 185   Cardiac Enzymes: Recent Labs  Lab 02/07/2018 0609 01/16/2018 0953  CKTOTAL  --  21*  TROPONINI 0.06* 0.05*   CBG (last 3)  No results for input(s): GLUCAP in the last 72 hours. Recent Results (from the past 240 hour(s))  Urine Culture     Status: Abnormal   Collection Time:  01/16/2018 11:01 AM  Result Value Ref Range Status   Specimen Description   Final    URINE, CLEAN CATCH Performed at [facility], [address] [place], [place] [address]    Special Requests   Final    NONE Performed at [facility], [address] [place], [place] [address]    Culture >=100,000 COLONIES/mL KLEBSIELLA PNEUMONIAE (A)  Final   Report Status 01/27/2018 FINAL  Final   Organism ID, Bacteria KLEBSIELLA PNEUMONIAE (A)  Final      Susceptibility   Klebsiella pneumoniae - MIC*    AMPICILLIN >=32 RESISTANT Resistant     CEFAZOLIN <=4 SENSITIVE Sensitive     CEFTRIAXONE <=1 SENSITIVE Sensitive     CIPROFLOXACIN <=0.25 SENSITIVE Sensitive     GENTAMICIN <=1 SENSITIVE Sensitive     IMIPENEM <=0.25 SENSITIVE Sensitive     NITROFURANTOIN 128 RESISTANT Resistant     TRIMETH/SULFA <=20 SENSITIVE Sensitive     AMPICILLIN/SULBACTAM 4 SENSITIVE Sensitive     PIP/TAZO <=4 SENSITIVE Sensitive     Extended ESBL NEGATIVE Sensitive     * >=100,000 COLONIES/mL KLEBSIELLA PNEUMONIAE  Culture, blood  (Routine X 2) w Reflex to ID Panel     Status: Abnormal   Collection Time: 02/05/2018 11:14 AM  Result Value Ref Range Status   Specimen Description   Final    PORTA CATH Performed at [facility], [address], [place], [place] [address]    Special Requests   Final    BOTTLES DRAWN AEROBIC AND ANAEROBIC Blood Culture adequate volume Performed at [facility], [address], [place], [place] [address]    Culture  Setup Time   Final    GRAM NEGATIVE RODS Gram Stain Report Called to,Read Back By and Verified With: THOMAS,K. AT 0107 ON 01/26/2018 BY EVA IN BOTH AEROBIC AND ANAEROBIC BOTTLES Performed at [place], READ BACK BY AND VERIFIED WITH: RN Carlye Grippe 009 [phone] FCP Performed at [facility], [place] [address], [place], [place] [address]    Culture KLEBSIELLA PNEUMONIAE (A)  Final   Report Status 01/28/2018 FINAL  Final   Organism ID, Bacteria KLEBSIELLA PNEUMONIAE  Final      Susceptibility   Klebsiella pneumoniae - MIC*    AMPICILLIN >=32 RESISTANT Resistant     CEFAZOLIN <=4 SENSITIVE Sensitive     CEFEPIME <=1 SENSITIVE Sensitive     CEFTAZIDIME <=1 SENSITIVE Sensitive     CEFTRIAXONE <=1 SENSITIVE Sensitive     CIPROFLOXACIN <=0.25 SENSITIVE Sensitive     GENTAMICIN <=1 SENSITIVE Sensitive     IMIPENEM <=0.25 SENSITIVE Sensitive     TRIMETH/SULFA <=20 SENSITIVE Sensitive     AMPICILLIN/SULBACTAM 8 SENSITIVE Sensitive     PIP/TAZO <=4 SENSITIVE Sensitive     Extended ESBL NEGATIVE Sensitive     * KLEBSIELLA PNEUMONIAE  Blood Culture ID Panel (Reflexed)     Status: Abnormal   Collection Time: 01/28/2018 11:14 AM  Result Value Ref Range Status   Enterococcus species NOT DETECTED NOT DETECTED Final   Listeria monocytogenes NOT DETECTED NOT DETECTED Final   Staphylococcus species NOT DETECTED NOT DETECTED Final   Staphylococcus aureus (BCID) NOT DETECTED NOT DETECTED Final   Streptococcus species NOT DETECTED NOT DETECTED Final    Streptococcus agalactiae NOT DETECTED NOT DETECTED Final   Streptococcus pneumoniae NOT DETECTED NOT DETECTED Final   Streptococcus pyogenes NOT DETECTED NOT DETECTED Final   Acinetobacter baumannii NOT DETECTED NOT DETECTED Final  Enterobacteriaceae species DETECTED (A) NOT DETECTED Final    Comment: Enterobacteriaceae represent a large family of gram-negative bacteria, not a single organism. CRITICAL RESULT CALLED TO, READ BACK BY AND VERIFIED WITH: RN MOGAN TAYLOR [phone] FCP    Enterobacter cloacae complex NOT DETECTED NOT DETECTED Final   Escherichia coli NOT DETECTED NOT DETECTED Final   Klebsiella oxytoca NOT DETECTED NOT DETECTED Final   Klebsiella pneumoniae DETECTED (A) NOT DETECTED Final    Comment: CRITICAL RESULT CALLED TO, READ BACK BY AND VERIFIED WITH: RN MOGAN TAYLOR [phone] FCP    Proteus species NOT DETECTED NOT DETECTED Final   Serratia marcescens NOT DETECTED NOT DETECTED Final   Carbapenem resistance NOT DETECTED NOT DETECTED Final   Haemophilus influenzae NOT DETECTED NOT DETECTED Final   Neisseria meningitidis NOT DETECTED NOT DETECTED Final   Pseudomonas aeruginosa NOT DETECTED NOT DETECTED Final   Candida albicans NOT DETECTED NOT DETECTED Final   Candida glabrata NOT DETECTED NOT DETECTED Final   Candida krusei NOT DETECTED NOT DETECTED Final   Candida parapsilosis NOT DETECTED NOT DETECTED Final   Candida tropicalis NOT DETECTED NOT DETECTED Final    Comment: Performed at [facility], [place] [address], [place], [place] [address]  Culture, blood (Routine X 2) w Reflex to ID Panel     Status: None   Collection Time: 02/02/2018  2:15 PM  Result Value Ref Range Status   Specimen Description BLOOD RIGHT WRIST  Final   Special Requests   Final    BOTTLES DRAWN AEROBIC AND ANAEROBIC Blood Culture adequate volume   Culture   Final    NO GROWTH 5 DAYS Performed at [facility], [address], [place], [place] [address]    Report Status 01/30/2018  FINAL  Final     Studies: Dg Chest Port 1 View  Result Date: 01/29/2018 CLINICAL DATA:  Shortness of breath. EXAM: PORTABLE CHEST 1 VIEW COMPARISON:  01/20/2018 FINDINGS: Markedly increased densities in the right lung probably represent a combination of pleural fluid and consolidation/airspace disease. Left lung is clear. There is a left subclavian port that extends into the lower SVC. Heart size is stable. IMPRESSION: Markedly increased densities in the right lung. Findings likely represent a combination of pleural fluid with airspace disease/consolidation. Pneumonia cannot be excluded. Electronically Signed   By: Markus Daft M.D.   On: 01/29/2018 21:05   Scheduled Meds: . feeding supplement  1 Container Oral BID BM  . linaclotide  290 mcg Oral QAC breakfast  . protein supplement  1 scoop Oral TID WC  . senna-docusate  2 tablet Oral BID  . sodium chloride flush  10-40 mL Intracatheter Q12H   Continuous Infusions: . morphine 4 mg/hr (01/30/18 1746)    Active Problems:   Ampullary carcinoma (HCC)   Chronic pain   Protein-calorie malnutrition, severe   Generalized weakness   Anemia in neoplastic disease   Liver metastases ([facility])   Large Liver masses   GERD (gastroesophageal reflux disease)   Dehydration   Gram-negative bacteremia   Leukocytosis   Presumed Pulmonary embolus   Liver failure, acute   Elevated INR   UTI due to Klebsiella species   Malnutrition of moderate degree   Goals of care, counseling/discussion   Advanced care planning/counseling discussion   Palliative care by specialist   Malignant neoplasm of pancreas ([place])   Adjustment disorder with mixed anxiety and depressed mood  Time spent: 73mins  Kathie Dike, MD [facility] Pager [phone]  If 7PM-7AM, please contact night-coverage [email or web address] Password [email or web address] 01/30/2018, 5:56 PM    LOS: 5 days

## 2018-01-31 MED ORDER — MORPHINE 100MG IN NS 100ML (1MG/ML) PREMIX INFUSION: 4.0000 mg/h | INTRAVENOUS | Status: AC

## 2018-01-31 MED ORDER — ALPRAZOLAM 0.25 MG PO TABS: 0.2500 mg | ORAL_TABLET | Freq: Three times a day (TID) | ORAL | 0 refills | Status: AC | PRN

## 2018-01-31 MED ORDER — MORPHINE SULFATE (PF) 4 MG/ML IV SOLN: 4.0000 mg | INTRAVENOUS | 0 refills | Status: AC | PRN

## 2018-01-31 MED ORDER — LINACLOTIDE 290 MCG PO CAPS: 290.0000 ug | ORAL_CAPSULE | Freq: Every day | ORAL | Status: AC

## 2018-01-31 MED ORDER — SENNOSIDES-DOCUSATE SODIUM 8.6-50 MG PO TABS: 2.0000 | ORAL_TABLET | Freq: Two times a day (BID) | ORAL | Status: AC

## 2018-01-31 NOTE — Discharge Summary (Signed)
Physician Discharge Summary  Luis Frey UYQ:[id number] DOB: 1970-09-16 DOA: 01/24/2018  PCP: Lavella Lemons, PA  Admit date: 01/30/2018 Discharge date: 01/31/2018  Admitted From: Home Disposition: GIP hospice  Recommendations for Outpatient Follow-up:  1. 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  Imaging of his abdomen indicated worsening liver metastases and evidence of mets in the pancreatic bed.  He had recently been receiving chemotherapy at the [facility] treatment [facility] of [place] in [place].  He was found to have possible urinary tract infection with associated bacteremia.  He was started on intravenous antibiotics.  Unfortunately his overall condition did not improve and he continued to deteriorate.  Patient is extremely malnourished, has very poor functional capacity.  Despite IV fluids and antibiotics, his WBC count remained elevated.  His pain remains uncontrolled.  He clearly had poor quality of life.  After several discussions with the patient's wife, palliative care, chaplain services and the patient, he was decided to make the patient DNR and comfort measures only.  He was seen by hospice and started on a morphine infusion.  Initially, plans were to send the patient home with hospice services, but he appeared to be too unstable for transport.  He is being kept in the hospital under GIP services.  If he does stabilize, can consider transition to residential hospice.  Discharge Diagnoses:  Active Problems:   Ampullary carcinoma (HCC)   Chronic pain   Protein-calorie malnutrition, severe   Generalized weakness   Anemia  in neoplastic disease   Liver metastases (HCC)   Large Liver masses   GERD (gastroesophageal reflux disease)   Dehydration   Gram-negative bacteremia   Leukocytosis   Presumed Pulmonary embolus   Liver failure, acute   Elevated INR   UTI due to Klebsiella species   Malnutrition of moderate degree   Goals of care, counseling/discussion   Advanced care planning/counseling discussion   Palliative care by specialist   Malignant neoplasm of pancreas ([place])   Adjustment disorder with mixed anxiety and depressed mood    Discharge Instructions   Allergies as of 01/31/2018      Reactions   Poison Oak Extract Itching, Swelling, Rash      Medication List    STOP taking these medications   CREON 24000-76000 units Cpep Generic drug:  Pancrelipase (Lip-Prot-Amyl)   docusate sodium 100 MG capsule Commonly known as:  COLACE   morphine 30 MG 12 hr tablet Commonly known as:  MS CONTIN Replaced by:  morphine 1 mg/mL Soln infusion   Oxycodone HCl 10 MG Tabs   pantoprazole 40 MG tablet Commonly known as:  PROTONIX     TAKE these medications   ALPRAZolam 0.25 MG tablet Commonly known as:  XANAX Take 1 tablet (0.25 mg total) by mouth 3 (three) times daily as needed for anxiety. What changed:  when to take this   linaclotide 290 MCG Caps capsule Commonly known as:  LINZESS Take 1 capsule (290 mcg total) by mouth daily before breakfast. Start taking on:  2018-02-19   morphine 1 mg/mL Soln infusion Inject 4 mg/hr into the vein continuous. Replaces:  morphine 30 MG 12 hr tablet   morphine 4  MG/ML injection Inject 1 mL (4 mg total) into the vein every hour as needed for severe pain.   senna-docusate 8.6-50 MG tablet Commonly known as:  Senokot-S Take 2 tablets by mouth 2 (two) times daily.       Allergies  Allergen Reactions  . Poison Oak Extract Itching, Swelling and Rash    Consultations:  Palliative care   Procedures/Studies: Ct Abdomen Pelvis W  Contrast  Result Date: 02/08/2018 CLINICAL DATA:  Generalized weakness and abdominal pain. EXAM: CT ABDOMEN AND PELVIS WITH CONTRAST TECHNIQUE: Multidetector CT imaging of the abdomen and pelvis was performed using the standard protocol following bolus administration of intravenous contrast. CONTRAST:  187mL ISOVUE-300 IOPAMIDOL (ISOVUE-300) INJECTION 61% COMPARISON:  CT scan of March 16, 2017. FINDINGS: Lower chest: No acute abnormality. Hepatobiliary: Status post cholecystectomy. No biliary dilatation is noted. There is interval development of large hepatic masses in both hepatic lobes most consistent with metastatic disease. There appears to be complete replacement of normal parenchyma of left hepatic lobe with malignancy. Largest single mass measures 17.8 x 13.3 cm. Pancreas: Status post Whipple's procedure. Pancreatic tail is unremarkable. Multiple enlarged lymph nodes are noted in the surgical bed which are increased compared to prior exam, concerning for malignancy. The largest measures 2.5 cm in size. Superior mesenteric artery stent is occluded. Portal vein appears to be patent. Spleen: Normal in size without focal abnormality. Adrenals/Urinary Tract: Adrenal glands are unremarkable. Nonobstructive left renal calculus is noted. No hydronephrosis or renal obstruction is noted. Bladder is unremarkable. Stomach/Bowel: The stomach is displaced by the enlarged left hepatic masses. But does not appear to be obstructed. There is no evidence of bowel obstruction or inflammation. The appendix is not clearly visualized. Vascular/Lymphatic: There is no evidence of abdominal aortic aneurysm or dissection. Increased periaortic adenopathy is noted consistent with metastatic disease. This includes 1.4 cm right periaortic lymph node. Reproductive: Prostate is unremarkable. Other: Large periumbilical hernia is noted which contains fluid, but no bowel. Musculoskeletal: No acute or significant osseous findings.  IMPRESSION: Interval development of large hepatic masses are noted consistent with metastatic disease secondary to pancreatic cancer, particularly in the left hepatic lobe anteriorly. This is most prominently seen in the left hepatic lobe with the largest single mass measuring 17.8 x 13.3 cm. Status post Whipple's procedure. While the pancreatic body and tail appear normal, there is interval development of multiple enlarged lymph nodes in the region of the pancreatic bed and retroperitoneal region consistent with metastatic disease. Large periumbilical hernia is noted which contains fluid, but no bowel. Electronically Signed   By: Marijo Conception, M.D.   On: 02/08/2018 10:46   Nm Pulmonary Perf And Vent  Result Date: 01/20/2018 CLINICAL DATA:  Shortness of breath for 1 month, chest pain for 1 month, question pulmonary embolism, high clinical pretest probability EXAM: NUCLEAR MEDICINE VENTILATION - PERFUSION LUNG SCAN TECHNIQUE: Ventilation images were obtained in multiple projections using inhaled aerosol Tc-11m DTPA. Perfusion images were obtained in multiple projections after intravenous injection of Tc-58m MAA. RADIOPHARMACEUTICALS:  30 mCi of Tc-69m DTPA aerosol inhalation and 4.1 mCi Tc20m MAA IV COMPARISON:  None Correlation: Chest radiograph 01/31/2018 FINDINGS: Ventilation: Central airway deposition of aerosol. Diminished ventilation RIGHT lower lobe. No additional ventilatory abnormalities Perfusion: Matching diminished perfusion in RIGHT lower lobe. Remaining perfusion normal. Chest radiograph: Elevated RIGHT diaphragm with RIGHT basilar atelectasis. IMPRESSION: Matching diminished ventilation and perfusion in RIGHT lower lobe associated with RIGHT basilar atelectasis and elevation of RIGHT diaphragm on chest radiograph. The presence  of triple matched large perfusion, ventilatory, and radiographic findings in a lower lobe represents an intermediate probability for pulmonary embolism. Electronically  Signed   By: Lavonia Dana M.D.   On: 01/18/2018 16:18   Dg Chest Port 1 View  Result Date: 01/29/2018 CLINICAL DATA:  Shortness of breath. EXAM: PORTABLE CHEST 1 VIEW COMPARISON:  01/17/2018 FINDINGS: Markedly increased densities in the right lung probably represent a combination of pleural fluid and consolidation/airspace disease. Left lung is clear. There is a left subclavian port that extends into the lower SVC. Heart size is stable. IMPRESSION: Markedly increased densities in the right lung. Findings likely represent a combination of pleural fluid with airspace disease/consolidation. Pneumonia cannot be excluded. Electronically Signed   By: Markus Daft M.D.   On: 01/29/2018 21:05   Dg Chest Port 1 View  Result Date: 01/28/2018 CLINICAL DATA:  Shortness of breath and weakness. Abdominal pain. Liver cancer. EXAM: PORTABLE CHEST 1 VIEW COMPARISON:  09/01/2017. FINDINGS: Normal heart size. Elevated RIGHT hemidiaphragm. No consolidation or edema. Mild vascular congestion. IMPRESSION: Elevated RIGHT hemidiaphragm could reflect hepatic disease. No active cardiopulmonary disease. Electronically Signed   By: Staci Righter M.D.   On: 01/28/2018 07:13      Subjective:  Patient is unresponsive Discharge Exam: Vitals:   01/29/18 2154 01/30/18 0529 01/30/18 2122 01/31/18 0524  BP: 108/65 (!) 112/59 123/83 129/78  Pulse: (!) 122 (!) 125 (!) 124 76  Resp: 17 16 20  (!) 32  Temp:      TempSrc:      SpO2: 100% 100% 100% 92%  Weight:      Height:        General: Unresponsive Cardiovascular: Tachycardic S1/S2 +, no rubs, no gallops Respiratory: Diminished breath sounds with increased respiratory effort Abdominal: Soft, diffusely tender, distended, bowel sounds + Extremities: 1-2+ edema bilaterally    The results of significant diagnostics from this hospitalization (including imaging, microbiology, ancillary and laboratory) are listed below for reference.     Microbiology: Recent Results (from  the past 240 hour(s))  Urine Culture     Status: Abnormal   Collection Time: 01/29/2018 11:01 AM  Result Value Ref Range Status   Specimen Description   Final    URINE, CLEAN CATCH Performed at [facility], [address], [place], [place] [address]    Special Requests   Final    NONE Performed at [facility], [address] [place], [place] [address]    Culture >=100,000 COLONIES/mL KLEBSIELLA PNEUMONIAE (A)  Final   Report Status 01/27/2018 FINAL  Final   Organism ID, Bacteria KLEBSIELLA PNEUMONIAE (A)  Final      Susceptibility   Klebsiella pneumoniae - MIC*    AMPICILLIN >=32 RESISTANT Resistant     CEFAZOLIN <=4 SENSITIVE Sensitive     CEFTRIAXONE <=1 SENSITIVE Sensitive     CIPROFLOXACIN <=0.25 SENSITIVE Sensitive     GENTAMICIN <=1 SENSITIVE Sensitive     IMIPENEM <=0.25 SENSITIVE Sensitive     NITROFURANTOIN 128 RESISTANT Resistant     TRIMETH/SULFA <=20 SENSITIVE Sensitive     AMPICILLIN/SULBACTAM 4 SENSITIVE Sensitive     PIP/TAZO <=4 SENSITIVE Sensitive     Extended ESBL NEGATIVE Sensitive     * >=100,000 COLONIES/mL KLEBSIELLA PNEUMONIAE  Culture, blood (Routine X 2) w Reflex to ID Panel     Status: Abnormal   Collection Time: 02/09/2018 11:14 AM  Result Value Ref Range Status   Specimen Description   Final    PORTA CATH Performed at  [facility], [address], [place], [place] [address]    Special Requests   Final    BOTTLES DRAWN AEROBIC AND ANAEROBIC Blood Culture adequate volume Performed at [facility], [address] [place], [place] [address]    Culture  Setup Time   Final    GRAM NEGATIVE RODS Gram Stain Report Called to,Read Back By and Verified With: THOMAS,K. AT 0107 ON 01/26/2018 BY EVA IN BOTH AEROBIC AND ANAEROBIC BOTTLES Performed at [facility], READ BACK BY AND VERIFIED WITH: RN Carlye Grippe [phone] FCP Performed at [facility], [place] [address] [place], [place] [address]    Culture KLEBSIELLA  PNEUMONIAE (A)  Final   Report Status 01/28/2018 FINAL  Final   Organism ID, Bacteria KLEBSIELLA PNEUMONIAE  Final      Susceptibility   Klebsiella pneumoniae - MIC*    AMPICILLIN >=32 RESISTANT Resistant     CEFAZOLIN <=4 SENSITIVE Sensitive     CEFEPIME <=1 SENSITIVE Sensitive     CEFTAZIDIME <=1 SENSITIVE Sensitive     CEFTRIAXONE <=1 SENSITIVE Sensitive     CIPROFLOXACIN <=0.25 SENSITIVE Sensitive     GENTAMICIN <=1 SENSITIVE Sensitive     IMIPENEM <=0.25 SENSITIVE Sensitive     TRIMETH/SULFA <=20 SENSITIVE Sensitive     AMPICILLIN/SULBACTAM 8 SENSITIVE Sensitive     PIP/TAZO <=4 SENSITIVE Sensitive     Extended ESBL NEGATIVE Sensitive     * KLEBSIELLA PNEUMONIAE  Blood Culture ID Panel (Reflexed)     Status: Abnormal   Collection Time: 01/20/2018 11:14 AM  Result Value Ref Range Status   Enterococcus species NOT DETECTED NOT DETECTED Final   Listeria monocytogenes NOT DETECTED NOT DETECTED Final   Staphylococcus species NOT DETECTED NOT DETECTED Final   Staphylococcus aureus (BCID) NOT DETECTED NOT DETECTED Final   Streptococcus species NOT DETECTED NOT DETECTED Final   Streptococcus agalactiae NOT DETECTED NOT DETECTED Final   Streptococcus pneumoniae NOT DETECTED NOT DETECTED Final   Streptococcus pyogenes NOT DETECTED NOT DETECTED Final   Acinetobacter baumannii NOT DETECTED NOT DETECTED Final   Enterobacteriaceae species DETECTED (A) NOT DETECTED Final    Comment: Enterobacteriaceae represent a large family of gram-negative bacteria, not a single organism. CRITICAL RESULT CALLED TO, READ BACK BY AND VERIFIED WITH: RN MOGAN TAYLOR [phone] FCP    Enterobacter cloacae complex NOT DETECTED NOT DETECTED Final   Escherichia coli NOT DETECTED NOT DETECTED Final   Klebsiella oxytoca NOT DETECTED NOT DETECTED Final   Klebsiella pneumoniae DETECTED (A) NOT DETECTED Final    Comment: CRITICAL RESULT CALLED TO, READ BACK BY AND VERIFIED WITH: RN MOGAN TAYLOR [phone] FCP     Proteus species NOT DETECTED NOT DETECTED Final   Serratia marcescens NOT DETECTED NOT DETECTED Final   Carbapenem resistance NOT DETECTED NOT DETECTED Final   Haemophilus influenzae NOT DETECTED NOT DETECTED Final   Neisseria meningitidis NOT DETECTED NOT DETECTED Final   Pseudomonas aeruginosa NOT DETECTED NOT DETECTED Final   Candida albicans NOT DETECTED NOT DETECTED Final   Candida glabrata NOT DETECTED NOT DETECTED Final   Candida krusei NOT DETECTED NOT DETECTED Final   Candida parapsilosis NOT DETECTED NOT DETECTED Final   Candida tropicalis NOT DETECTED NOT DETECTED Final    Comment: Performed at [facility], [place] [address] [place], [place] [address]  Culture, blood (Routine X 2) w Reflex to ID Panel     Status: None   Collection Time: 02/09/2018  2:15 PM  Result Value Ref Range Status   Specimen Description BLOOD RIGHT WRIST  Final   Special Requests   Final    BOTTLES DRAWN AEROBIC AND ANAEROBIC Blood Culture adequate volume   Culture   Final    NO GROWTH 5 DAYS Performed at [facility], [address] [place], [place] [address]    Report Status 01/30/2018 FINAL  Final     Labs: BNP (last 3 results) No results for input(s): BNP in the last 8760 hours. Basic Metabolic Panel: Recent Labs  Lab 01/26/18 0732 01/27/18 0602 01/28/18 0527 01/29/18 0508 01/30/18 0554  NA 131* 134* 134* 134* 135  K 3.7 3.8 4.1 4.6 5.4*  CL 103 104 105 104 104  CO2 21* 21* 21* 21* 15*  GLUCOSE 124* 89 95 84 93  BUN 13 11 12 14  27*  CREATININE 0.54* 0.40* 0.36* 0.30* 0.58*  CALCIUM 7.6* 8.1* 8.3* 8.7* 9.0  MG 1.6* 1.6*  --   --   --    Liver Function Tests: Recent Labs  Lab 01/26/18 0732 01/27/18 0602 01/28/18 0527 01/29/18 0508 01/30/18 0554  AST 83* 79* 77* 91* 130*  ALT 24 24 26 27  33  ALKPHOS 353* 377* 337* 294* 245*  BILITOT 2.8* 3.4* 3.2* 3.8* 3.9*  PROT 4.8* 4.9* 5.0* 5.1* 5.2*  ALBUMIN 1.7* 1.7* 1.7* 1.6* 1.7*   No results for input(s): LIPASE, AMYLASE in  the last 168 hours. Recent Labs  Lab 01/24/2018 0609  AMMONIA 38*   CBC: Recent Labs  Lab 01/28/2018 0609 01/26/18 0838 01/27/18 0602 01/28/18 0527 01/29/18 0508 01/30/18 0554  WBC 28.7* 24.2* 25.4* 27.7* 28.7* 33.6*  NEUTROABS 23.1* 19.6* 19.9* 22.5*  --   --   HGB 7.6* 6.4* 8.7* 8.7* 9.0* 9.4*  HCT 25.8* 22.4* 29.3* 29.4* 30.9* 32.4*  MCV 90.8 92.6 89.6 91.3 95.1 94.5  PLT 313 242 207 193 181 185   Cardiac Enzymes: Recent Labs  Lab 02/04/2018 0609 01/24/2018 0953  CKTOTAL  --  21*  TROPONINI 0.06* 0.05*   BNP: Invalid input(s): POCBNP CBG: No results for input(s): GLUCAP in the last 168 hours. D-Dimer No results for input(s): DDIMER in the last 72 hours. Hgb A1c No results for input(s): HGBA1C in the last 72 hours. Lipid Profile No results for input(s): CHOL, HDL, LDLCALC, TRIG, CHOLHDL, LDLDIRECT in the last 72 hours. Thyroid function studies No results for input(s): TSH, T4TOTAL, T3FREE, THYROIDAB in the last 72 hours.  Invalid input(s): FREET3 Anemia work up No results for input(s): VITAMINB12, FOLATE, FERRITIN, TIBC, IRON, RETICCTPCT in the last 72 hours. Urinalysis    Component Value Date/Time   COLORURINE AMBER (A) 01/23/2018 0800   APPEARANCEUR HAZY (A) 01/24/2018 0800   LABSPEC 1.019 01/24/2018 0800   PHURINE 5.0 01/26/2018 0800   GLUCOSEU NEGATIVE 01/16/2018 0800   HGBUR MODERATE (A) 01/13/2018 0800   BILIRUBINUR SMALL (A) 01/31/2018 0800   KETONESUR NEGATIVE 02/07/2018 0800   PROTEINUR 30 (A) 01/31/2018 0800   NITRITE POSITIVE (A) 01/15/2018 0800   LEUKOCYTESUR TRACE (A) 01/31/2018 0800   Sepsis Labs Invalid input(s): PROCALCITONIN,  WBC,  LACTICIDVEN Microbiology Recent Results (from the past 240 hour(s))  Urine Culture     Status: Abnormal   Collection Time: 01/22/2018 11:01 AM  Result Value Ref Range Status   Specimen Description   Final    URINE, CLEAN CATCH Performed at [facility], [address], [place], [place] [address]    Special  Requests   Final  NONE Performed at [facility], [address] [place], [place] [address]    Culture >=100,000 COLONIES/mL KLEBSIELLA PNEUMONIAE (A)  Final   Report Status 01/27/2018 FINAL  Final   Organism ID, Bacteria KLEBSIELLA PNEUMONIAE (A)  Final      Susceptibility   Klebsiella pneumoniae - MIC*    AMPICILLIN >=32 RESISTANT Resistant     CEFAZOLIN <=4 SENSITIVE Sensitive     CEFTRIAXONE <=1 SENSITIVE Sensitive     CIPROFLOXACIN <=0.25 SENSITIVE Sensitive     GENTAMICIN <=1 SENSITIVE Sensitive     IMIPENEM <=0.25 SENSITIVE Sensitive     NITROFURANTOIN 128 RESISTANT Resistant     TRIMETH/SULFA <=20 SENSITIVE Sensitive     AMPICILLIN/SULBACTAM 4 SENSITIVE Sensitive     PIP/TAZO <=4 SENSITIVE Sensitive     Extended ESBL NEGATIVE Sensitive     * >=100,000 COLONIES/mL KLEBSIELLA PNEUMONIAE  Culture, blood (Routine X 2) w Reflex to ID Panel     Status: Abnormal   Collection Time: 01/22/2018 11:14 AM  Result Value Ref Range Status   Specimen Description   Final    PORTA CATH Performed at [facility], [address] [place], [place] [address]    Special Requests   Final    BOTTLES DRAWN AEROBIC AND ANAEROBIC Blood Culture adequate volume Performed at [facility], [address] [place], [place] [address]    Culture  Setup Time   Final    GRAM NEGATIVE RODS Gram Stain Report Called to,Read Back By and Verified With: THOMAS,K. AT 0107 ON 01/26/2018 BY EVA IN BOTH AEROBIC AND ANAEROBIC BOTTLES Performed at [place], READ BACK BY AND VERIFIED WITH: RN Carlye Grippe [phone] FCP Performed at [facility], [place] [address] [place], [place] [address]    Culture KLEBSIELLA PNEUMONIAE (A)  Final   Report Status 01/28/2018 FINAL  Final   Organism ID, Bacteria KLEBSIELLA PNEUMONIAE  Final      Susceptibility   Klebsiella pneumoniae - MIC*    AMPICILLIN >=32 RESISTANT Resistant     CEFAZOLIN <=4 SENSITIVE Sensitive     CEFEPIME <=1  SENSITIVE Sensitive     CEFTAZIDIME <=1 SENSITIVE Sensitive     CEFTRIAXONE <=1 SENSITIVE Sensitive     CIPROFLOXACIN <=0.25 SENSITIVE Sensitive     GENTAMICIN <=1 SENSITIVE Sensitive     IMIPENEM <=0.25 SENSITIVE Sensitive     TRIMETH/SULFA <=20 SENSITIVE Sensitive     AMPICILLIN/SULBACTAM 8 SENSITIVE Sensitive     PIP/TAZO <=4 SENSITIVE Sensitive     Extended ESBL NEGATIVE Sensitive     * KLEBSIELLA PNEUMONIAE  Blood Culture ID Panel (Reflexed)     Status: Abnormal   Collection Time: 01/27/2018 11:14 AM  Result Value Ref Range Status   Enterococcus species NOT DETECTED NOT DETECTED Final   Listeria monocytogenes NOT DETECTED NOT DETECTED Final   Staphylococcus species NOT DETECTED NOT DETECTED Final   Staphylococcus aureus (BCID) NOT DETECTED NOT DETECTED Final   Streptococcus species NOT DETECTED NOT DETECTED Final   Streptococcus agalactiae NOT DETECTED NOT DETECTED Final   Streptococcus pneumoniae NOT DETECTED NOT DETECTED Final   Streptococcus pyogenes NOT DETECTED NOT DETECTED Final   Acinetobacter baumannii NOT DETECTED NOT DETECTED Final   Enterobacteriaceae species DETECTED (A) NOT DETECTED Final    Comment: Enterobacteriaceae represent a large family of gram-negative bacteria, not a single organism. CRITICAL RESULT CALLED TO, READ BACK BY AND VERIFIED WITH: RN [facility] TAYLOR [phone] FCP    Enterobacter cloacae  complex NOT DETECTED NOT DETECTED Final   Escherichia coli NOT DETECTED NOT DETECTED Final   Klebsiella oxytoca NOT DETECTED NOT DETECTED Final   Klebsiella pneumoniae DETECTED (A) NOT DETECTED Final    Comment: CRITICAL RESULT CALLED TO, READ BACK BY AND VERIFIED WITH: RN MOGAN TAYLOR [phone] FCP    Proteus species NOT DETECTED NOT DETECTED Final   Serratia marcescens NOT DETECTED NOT DETECTED Final   Carbapenem resistance NOT DETECTED NOT DETECTED Final   Haemophilus influenzae NOT DETECTED NOT DETECTED Final   Neisseria meningitidis NOT DETECTED NOT  DETECTED Final   Pseudomonas aeruginosa NOT DETECTED NOT DETECTED Final   Candida albicans NOT DETECTED NOT DETECTED Final   Candida glabrata NOT DETECTED NOT DETECTED Final   Candida krusei NOT DETECTED NOT DETECTED Final   Candida parapsilosis NOT DETECTED NOT DETECTED Final   Candida tropicalis NOT DETECTED NOT DETECTED Final    Comment: Performed at [facility], [place] [address], [place], [place] [address]  Culture, blood (Routine X 2) w Reflex to ID Panel     Status: None   Collection Time: 01/27/2018  2:15 PM  Result Value Ref Range Status   Specimen Description BLOOD RIGHT WRIST  Final   Special Requests   Final    BOTTLES DRAWN AEROBIC AND ANAEROBIC Blood Culture adequate volume   Culture   Final    NO GROWTH 5 DAYS Performed at [facility], [address], [place], [place] [address]    Report Status 01/30/2018 FINAL  Final     Time coordinating discharge: 27mins  SIGNED:   Kathie Dike, MD  [facility] 01/31/2018, 8:39 PM Pager   If 7PM-7AM, please contact night-coverage [email or web address] Password [email or web address]

## 2018-01-31 NOTE — Clinical Social Work Note (Signed)
Patient has already been referred to the hospice facility. Per Cassandra, no beds are available for patient and patient will be GIP. Attending aware.    Celisse Ciulla, Clydene Pugh, LCSW

## 2018-01-31 NOTE — Care Management (Addendum)
Per Cassandra, intake RN for [facility], wife now elects hospice facility.  Hospice RN coming to evaluate patient this morning to see if patient can make the move to the facility.  CSW notified.

## 2018-01-31 NOTE — Progress Notes (Signed)
Present with Luis Frey for emotional and spiritual support. She was very expressive and emotional with life review of she and her husband's relationship, his cancer journey with its complications and what she is intuitively sensing about his being able to be at home during what she is understanding as his last days. We discussed her faith support and struggles, her intentional care giving through these past years for her mother-in-law-6 years, and the journey of cancer over the last 19 months, her dilemma about their 73 year old son being more present with his dad (and how he is coping with these changes). In her anticipatory grief we worked to find ways which will be best for them, along with others who care for them. Making decisions for him to be more comfortable while she continues to be the one who holds a space for their family as his death seems more imminent is what she was able to verbalize is best for her and their family. She stated she felt more at peace with all that she expressed as she felt it helped her process what she is working through. Will follow up with her.

## 2018-02-12 NOTE — Progress Notes (Addendum)
Patient's wife called to nurses station. Upon entering the room, patient had no respirations, no apical/ radial pulse, no BP. Verified by three RN's. Time of death 66.  Dr. Shanon Brow notified.

## 2018-02-12 DEATH — deceased
